# Patient Record
Sex: Male | Born: 1950 | Race: White | Hispanic: No | Marital: Married | Smoking: Former smoker
Health system: Southern US, Community
[De-identification: ages and names within clinical notes are randomized; demographics above are authoritative.]

## PROBLEM LIST (undated history)

## (undated) DIAGNOSIS — T4145XA Adverse effect of unspecified anesthetic, initial encounter: Secondary | ICD-10-CM

## (undated) DIAGNOSIS — H269 Unspecified cataract: Secondary | ICD-10-CM

## (undated) DIAGNOSIS — Z9889 Other specified postprocedural states: Secondary | ICD-10-CM

## (undated) DIAGNOSIS — M069 Rheumatoid arthritis, unspecified: Secondary | ICD-10-CM

## (undated) DIAGNOSIS — T753XXA Motion sickness, initial encounter: Secondary | ICD-10-CM

## (undated) DIAGNOSIS — T8859XA Other complications of anesthesia, initial encounter: Secondary | ICD-10-CM

## (undated) DIAGNOSIS — K635 Polyp of colon: Secondary | ICD-10-CM

## (undated) DIAGNOSIS — J329 Chronic sinusitis, unspecified: Secondary | ICD-10-CM

## (undated) DIAGNOSIS — R7303 Prediabetes: Secondary | ICD-10-CM

## (undated) DIAGNOSIS — M1990 Unspecified osteoarthritis, unspecified site: Secondary | ICD-10-CM

## (undated) DIAGNOSIS — G629 Polyneuropathy, unspecified: Secondary | ICD-10-CM

## (undated) DIAGNOSIS — J302 Other seasonal allergic rhinitis: Secondary | ICD-10-CM

## (undated) DIAGNOSIS — M21372 Foot drop, left foot: Secondary | ICD-10-CM

## (undated) DIAGNOSIS — K219 Gastro-esophageal reflux disease without esophagitis: Secondary | ICD-10-CM

## (undated) DIAGNOSIS — R112 Nausea with vomiting, unspecified: Secondary | ICD-10-CM

## (undated) DIAGNOSIS — E785 Hyperlipidemia, unspecified: Secondary | ICD-10-CM

## (undated) DIAGNOSIS — I10 Essential (primary) hypertension: Secondary | ICD-10-CM

## (undated) DIAGNOSIS — R42 Dizziness and giddiness: Secondary | ICD-10-CM

## (undated) HISTORY — PX: VASECTOMY: SHX75

## (undated) HISTORY — PX: TOTAL HIP ARTHROPLASTY: SHX124

## (undated) HISTORY — PX: EYE SURGERY: SHX253

## (undated) HISTORY — DX: Rheumatoid arthritis, unspecified: M06.9

## (undated) HISTORY — DX: Unspecified cataract: H26.9

## (undated) HISTORY — PX: APPENDECTOMY: SHX54

## (undated) HISTORY — PX: JOINT REPLACEMENT: SHX530

## (undated) HISTORY — PX: BACK SURGERY: SHX140

## (undated) HISTORY — PX: SPINE SURGERY: SHX786

## (undated) HISTORY — DX: Polyp of colon: K63.5

---
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
## 1969-02-20 HISTORY — PX: APPENDECTOMY: SHX54

## 2008-04-07 ENCOUNTER — Ambulatory Visit: Payer: Self-pay | Admitting: Internal Medicine

## 2008-04-07 ENCOUNTER — Ambulatory Visit (HOSPITAL_COMMUNITY): Admission: RE | Admit: 2008-04-07 | Discharge: 2008-04-07 | Payer: Self-pay | Admitting: Internal Medicine

## 2008-04-22 ENCOUNTER — Encounter: Payer: Self-pay | Admitting: Internal Medicine

## 2008-06-25 ENCOUNTER — Ambulatory Visit (HOSPITAL_COMMUNITY): Admission: RE | Admit: 2008-06-25 | Discharge: 2008-06-25 | Payer: Self-pay | Admitting: Family Medicine

## 2010-05-18 ENCOUNTER — Other Ambulatory Visit: Payer: Self-pay | Admitting: Neurosurgery

## 2010-05-18 DIAGNOSIS — M47816 Spondylosis without myelopathy or radiculopathy, lumbar region: Secondary | ICD-10-CM

## 2010-05-19 ENCOUNTER — Ambulatory Visit
Admission: RE | Admit: 2010-05-19 | Discharge: 2010-05-19 | Disposition: A | Discharge status: Home / Self Care | Payer: 59 | Source: Ambulatory Visit | Attending: Neurosurgery | Admitting: Neurosurgery

## 2010-05-19 ENCOUNTER — Other Ambulatory Visit: Payer: Self-pay | Admitting: Neurosurgery

## 2010-05-19 DIAGNOSIS — M47816 Spondylosis without myelopathy or radiculopathy, lumbar region: Secondary | ICD-10-CM

## 2010-06-15 ENCOUNTER — Other Ambulatory Visit: Payer: Self-pay | Admitting: Neurosurgery

## 2010-06-15 DIAGNOSIS — M47816 Spondylosis without myelopathy or radiculopathy, lumbar region: Secondary | ICD-10-CM

## 2010-06-16 ENCOUNTER — Ambulatory Visit
Admission: RE | Admit: 2010-06-16 | Discharge: 2010-06-16 | Disposition: A | Discharge status: Home / Self Care | Payer: 59 | Source: Ambulatory Visit | Attending: Neurosurgery | Admitting: Neurosurgery

## 2010-06-16 DIAGNOSIS — M47816 Spondylosis without myelopathy or radiculopathy, lumbar region: Secondary | ICD-10-CM

## 2010-07-05 NOTE — Op Note (Signed)
NAMEEUAN, Lawrence White NO.:  [id number]   MEDICAL RECORD NO.:  [id number]          PATIENT TYPE:  AMB   LOCATION:  DAY                           FACILITY:  [facility]   PHYSICIAN:  R. Roetta Sessions, M.D. DATE OF BIRTH:  November 27, 1950   DATE OF PROCEDURE:  04/07/2008  DATE OF DISCHARGE:                               OPERATIVE REPORT   PROCEDURE:  Surveillance colonoscopy.   INDICATIONS FOR PROCEDURE:  A 60 year old gentleman who underwent a  colonoscopy in [place], [place] 5 years ago.  A polyp was removed.  No  further information is known.  However, he was told return in 5 years  for a surveillance examination.  [name] is having no lower GI tract  symptoms.  There is no family history of polyps or colorectal neoplasia.  Colonoscopy is now being done as a surveillance maneuver.  The risks,  benefits, alternatives and limitations have been reviewed and questions  answered.  Please see the documentation in the medical record.   PROCEDURE NOTE:  O2 saturation, blood pressure, pulse and respiration  were monitored throughout the entire procedure.  Conscious sedation with  Versed 4 mg IV and Demerol 75 mg IV in divided doses.   INSTRUMENT:  Pentax video chip system.   FINDINGS:  Digital rectal exam revealed no abnormalities.   Endoscopic findings:  The prep was excellent.  Colon:  Colonic mucosa  was surveyed from the rectosigmoid junction through the left,  transverse, right colon to the appendiceal orifice, ileocecal valve and  cecum.  These structures were well seen and photographed for the record.  The terminal ileum was intubated about 10 cm.  From this level, the  scope was slowly and cautiously withdrawn.  All previously mentioned  mucosal surfaces were again seen.  The patient was noted to have just a  couple of left-sided diverticula.  The remainder of the colonic mucosa  and terminal ileum mucosa appeared entirely normal.  The scope was  pulled down in the  rectum where a thorough examination of the rectal  mucosa, including a retroflexed view of the anal verge demonstrated some  minimal anal canal hemorrhoids only.  The patient tolerated the  procedure well and was reactive in endoscopy.  Cecal withdrawal time 7  minutes.   IMPRESSION:  Anal canal hemorrhoids, otherwise normal rectum.  A few  left-sided diverticula.  Remainder of the colonic mucosa and terminal  ileum mucosa appeared normal.   RECOMMENDATION:  Diverticulosis literature provided to [name].  Without having further information about his prior history of polyps,  would recommend [name] return in 5 years for a repeat surveillance  examination.      Jonathon Bellows, M.D.  Electronically Signed     RMR/MEDQ  D:  04/07/2008  T:  04/07/2008  Job:  [id number]   cc:   Melvyn Novas, MD  Fax: [phone]

## 2010-08-30 ENCOUNTER — Other Ambulatory Visit: Payer: Self-pay | Admitting: Neurosurgery

## 2010-08-30 DIAGNOSIS — M47816 Spondylosis without myelopathy or radiculopathy, lumbar region: Secondary | ICD-10-CM

## 2010-08-31 ENCOUNTER — Ambulatory Visit
Admission: RE | Admit: 2010-08-31 | Discharge: 2010-08-31 | Disposition: A | Discharge status: Home / Self Care | Payer: 59 | Source: Ambulatory Visit | Attending: Neurosurgery | Admitting: Neurosurgery

## 2010-08-31 ENCOUNTER — Other Ambulatory Visit: Payer: Self-pay | Admitting: Neurosurgery

## 2010-08-31 DIAGNOSIS — M47816 Spondylosis without myelopathy or radiculopathy, lumbar region: Secondary | ICD-10-CM

## 2010-08-31 MED ORDER — METHYLPREDNISOLONE ACETATE 40 MG/ML IJ SUSP
120.0000 mg | Freq: Once | INTRAMUSCULAR | Status: AC
  Administered 2010-08-31: 120 mg via INTRA_ARTICULAR

## 2011-02-01 ENCOUNTER — Other Ambulatory Visit: Payer: Self-pay | Admitting: Neurosurgery

## 2011-02-01 DIAGNOSIS — M47816 Spondylosis without myelopathy or radiculopathy, lumbar region: Secondary | ICD-10-CM

## 2011-02-08 ENCOUNTER — Other Ambulatory Visit: Payer: Self-pay | Admitting: Neurosurgery

## 2011-02-08 ENCOUNTER — Ambulatory Visit
Admission: RE | Admit: 2011-02-08 | Discharge: 2011-02-08 | Disposition: A | Discharge status: Home / Self Care | Payer: 59 | Source: Ambulatory Visit | Attending: Neurosurgery | Admitting: Neurosurgery

## 2011-02-08 DIAGNOSIS — M47816 Spondylosis without myelopathy or radiculopathy, lumbar region: Secondary | ICD-10-CM

## 2011-02-08 MED ORDER — METHYLPREDNISOLONE ACETATE 40 MG/ML INJ SUSP (RADIOLOG
120.0000 mg | Freq: Once | INTRAMUSCULAR | Status: AC
  Administered 2011-02-08: 120 mg via INTRA_ARTICULAR

## 2011-02-08 MED ORDER — IOHEXOL 180 MG/ML  SOLN
1.0000 mL | Freq: Once | INTRAMUSCULAR | Status: AC | PRN
  Administered 2011-02-08: 1 mL via INTRA_ARTICULAR

## 2011-05-12 ENCOUNTER — Other Ambulatory Visit: Payer: Self-pay | Admitting: Neurosurgery

## 2011-05-12 DIAGNOSIS — M47816 Spondylosis without myelopathy or radiculopathy, lumbar region: Secondary | ICD-10-CM

## 2011-05-17 ENCOUNTER — Other Ambulatory Visit: Payer: Self-pay | Admitting: Neurosurgery

## 2011-05-17 ENCOUNTER — Ambulatory Visit
Admission: RE | Admit: 2011-05-17 | Discharge: 2011-05-17 | Disposition: A | Discharge status: Home / Self Care | Payer: 59 | Source: Ambulatory Visit | Attending: Neurosurgery | Admitting: Neurosurgery

## 2011-05-17 VITALS — BP 140/82 | HR 65

## 2011-05-17 DIAGNOSIS — M47816 Spondylosis without myelopathy or radiculopathy, lumbar region: Secondary | ICD-10-CM

## 2011-05-17 DIAGNOSIS — M5126 Other intervertebral disc displacement, lumbar region: Secondary | ICD-10-CM

## 2011-05-17 MED ORDER — METHYLPREDNISOLONE ACETATE 40 MG/ML INJ SUSP (RADIOLOG
120.0000 mg | Freq: Once | INTRAMUSCULAR | Status: AC
  Administered 2011-05-17: 120 mg via INTRA_ARTICULAR

## 2011-05-17 MED ORDER — IOHEXOL 180 MG/ML  SOLN
1.0000 mL | Freq: Once | INTRAMUSCULAR | Status: AC | PRN
  Administered 2011-05-17: 1 mL via INTRA_ARTICULAR

## 2011-05-17 NOTE — Discharge Instructions (Signed)

## 2011-09-01 ENCOUNTER — Other Ambulatory Visit: Payer: Self-pay | Admitting: Neurosurgery

## 2011-09-01 DIAGNOSIS — M47816 Spondylosis without myelopathy or radiculopathy, lumbar region: Secondary | ICD-10-CM

## 2011-09-07 ENCOUNTER — Other Ambulatory Visit: Payer: Self-pay | Admitting: Neurosurgery

## 2011-09-07 ENCOUNTER — Ambulatory Visit
Admission: RE | Admit: 2011-09-07 | Discharge: 2011-09-07 | Disposition: A | Discharge status: Home / Self Care | Payer: 59 | Source: Ambulatory Visit | Attending: Neurosurgery | Admitting: Neurosurgery

## 2011-09-07 DIAGNOSIS — M47816 Spondylosis without myelopathy or radiculopathy, lumbar region: Secondary | ICD-10-CM

## 2011-09-07 MED ORDER — METHYLPREDNISOLONE ACETATE 40 MG/ML INJ SUSP (RADIOLOG
120.0000 mg | Freq: Once | INTRAMUSCULAR | Status: AC
  Administered 2011-09-07: 120 mg via EPIDURAL

## 2011-09-07 MED ORDER — IOHEXOL 180 MG/ML  SOLN
1.0000 mL | Freq: Once | INTRAMUSCULAR | Status: AC | PRN
  Administered 2011-09-07: 1 mL via EPIDURAL

## 2012-01-23 ENCOUNTER — Other Ambulatory Visit: Payer: Self-pay | Admitting: Neurosurgery

## 2012-01-23 DIAGNOSIS — M47816 Spondylosis without myelopathy or radiculopathy, lumbar region: Secondary | ICD-10-CM

## 2012-01-29 ENCOUNTER — Ambulatory Visit
Admission: RE | Admit: 2012-01-29 | Discharge: 2012-01-29 | Disposition: A | Discharge status: Home / Self Care | Payer: 59 | Source: Ambulatory Visit | Attending: Neurosurgery | Admitting: Neurosurgery

## 2012-01-29 ENCOUNTER — Other Ambulatory Visit: Payer: Self-pay | Admitting: Neurosurgery

## 2012-01-29 DIAGNOSIS — M47816 Spondylosis without myelopathy or radiculopathy, lumbar region: Secondary | ICD-10-CM

## 2012-01-29 MED ORDER — IOHEXOL 180 MG/ML  SOLN
1.0000 mL | Freq: Once | INTRAMUSCULAR | Status: AC | PRN
  Administered 2012-01-29: 1 mL via EPIDURAL

## 2012-01-29 MED ORDER — METHYLPREDNISOLONE ACETATE 40 MG/ML INJ SUSP (RADIOLOG
120.0000 mg | Freq: Once | INTRAMUSCULAR | Status: AC
  Administered 2012-01-29: 120 mg via EPIDURAL

## 2012-05-30 ENCOUNTER — Other Ambulatory Visit: Payer: Self-pay | Admitting: Neurosurgery

## 2012-05-30 DIAGNOSIS — M47816 Spondylosis without myelopathy or radiculopathy, lumbar region: Secondary | ICD-10-CM

## 2012-06-03 ENCOUNTER — Other Ambulatory Visit: Payer: Self-pay | Admitting: Neurosurgery

## 2012-06-03 ENCOUNTER — Ambulatory Visit
Admission: RE | Admit: 2012-06-03 | Discharge: 2012-06-03 | Disposition: A | Discharge status: Home / Self Care | Payer: 59 | Source: Ambulatory Visit | Attending: Neurosurgery | Admitting: Neurosurgery

## 2012-06-03 DIAGNOSIS — M47816 Spondylosis without myelopathy or radiculopathy, lumbar region: Secondary | ICD-10-CM

## 2012-06-03 MED ORDER — IOHEXOL 180 MG/ML  SOLN
1.0000 mL | Freq: Once | INTRAMUSCULAR | Status: AC | PRN
  Administered 2012-06-03: 1 mL via EPIDURAL

## 2012-06-03 MED ORDER — METHYLPREDNISOLONE ACETATE 40 MG/ML INJ SUSP (RADIOLOG
120.0000 mg | Freq: Once | INTRAMUSCULAR | Status: AC
  Administered 2012-06-03: 120 mg via EPIDURAL

## 2012-06-28 ENCOUNTER — Other Ambulatory Visit: Payer: Self-pay | Admitting: Neurosurgery

## 2012-06-28 DIAGNOSIS — M47816 Spondylosis without myelopathy or radiculopathy, lumbar region: Secondary | ICD-10-CM

## 2012-07-03 ENCOUNTER — Ambulatory Visit
Admission: RE | Admit: 2012-07-03 | Discharge: 2012-07-03 | Disposition: A | Discharge status: Home / Self Care | Payer: 59 | Source: Ambulatory Visit | Attending: Neurosurgery | Admitting: Neurosurgery

## 2012-07-03 DIAGNOSIS — M47816 Spondylosis without myelopathy or radiculopathy, lumbar region: Secondary | ICD-10-CM

## 2012-07-03 MED ORDER — METHYLPREDNISOLONE ACETATE 40 MG/ML INJ SUSP (RADIOLOG
120.0000 mg | Freq: Once | INTRAMUSCULAR | Status: AC
  Administered 2012-07-03: 120 mg via EPIDURAL

## 2012-07-03 MED ORDER — IOHEXOL 180 MG/ML  SOLN
1.0000 mL | Freq: Once | INTRAMUSCULAR | Status: AC | PRN
  Administered 2012-07-03: 1 mL via EPIDURAL

## 2012-09-18 ENCOUNTER — Other Ambulatory Visit: Payer: Self-pay | Admitting: Neurosurgery

## 2012-09-18 DIAGNOSIS — M47816 Spondylosis without myelopathy or radiculopathy, lumbar region: Secondary | ICD-10-CM

## 2012-10-01 ENCOUNTER — Other Ambulatory Visit: Payer: Self-pay | Admitting: Neurosurgery

## 2012-10-01 ENCOUNTER — Ambulatory Visit
Admission: RE | Admit: 2012-10-01 | Discharge: 2012-10-01 | Disposition: A | Discharge status: Home / Self Care | Payer: 59 | Source: Ambulatory Visit | Attending: Neurosurgery | Admitting: Neurosurgery

## 2012-10-01 DIAGNOSIS — M47816 Spondylosis without myelopathy or radiculopathy, lumbar region: Secondary | ICD-10-CM

## 2012-10-01 MED ORDER — METHYLPREDNISOLONE ACETATE 40 MG/ML INJ SUSP (RADIOLOG
120.0000 mg | Freq: Once | INTRAMUSCULAR | Status: AC
  Administered 2012-10-01: 120 mg via EPIDURAL

## 2012-10-01 MED ORDER — IOHEXOL 180 MG/ML  SOLN
1.0000 mL | Freq: Once | INTRAMUSCULAR | Status: AC | PRN
  Administered 2012-10-01: 1 mL via EPIDURAL

## 2012-10-03 ENCOUNTER — Other Ambulatory Visit: Payer: Self-pay | Admitting: Neurosurgery

## 2012-10-03 DIAGNOSIS — M47817 Spondylosis without myelopathy or radiculopathy, lumbosacral region: Secondary | ICD-10-CM

## 2012-10-24 ENCOUNTER — Ambulatory Visit
Admission: RE | Admit: 2012-10-24 | Discharge: 2012-10-24 | Disposition: A | Discharge status: Home / Self Care | Payer: 59 | Source: Ambulatory Visit | Attending: Neurosurgery | Admitting: Neurosurgery

## 2012-10-24 DIAGNOSIS — M47817 Spondylosis without myelopathy or radiculopathy, lumbosacral region: Secondary | ICD-10-CM

## 2012-10-24 MED ORDER — GADOBENATE DIMEGLUMINE 529 MG/ML IV SOLN
18.0000 mL | Freq: Once | INTRAVENOUS | Status: AC | PRN
  Administered 2012-10-24: 18 mL via INTRAVENOUS

## 2013-08-28 DIAGNOSIS — R5381 Other malaise: Secondary | ICD-10-CM | POA: Diagnosis not present

## 2013-08-28 DIAGNOSIS — Z125 Encounter for screening for malignant neoplasm of prostate: Secondary | ICD-10-CM | POA: Diagnosis not present

## 2013-08-28 DIAGNOSIS — R5383 Other fatigue: Secondary | ICD-10-CM | POA: Diagnosis not present

## 2013-08-28 DIAGNOSIS — I10 Essential (primary) hypertension: Secondary | ICD-10-CM | POA: Diagnosis not present

## 2013-08-28 DIAGNOSIS — M48061 Spinal stenosis, lumbar region without neurogenic claudication: Secondary | ICD-10-CM | POA: Diagnosis not present

## 2013-08-28 DIAGNOSIS — E782 Mixed hyperlipidemia: Secondary | ICD-10-CM | POA: Diagnosis not present

## 2013-08-28 DIAGNOSIS — E785 Hyperlipidemia, unspecified: Secondary | ICD-10-CM | POA: Diagnosis not present

## 2013-08-28 DIAGNOSIS — M549 Dorsalgia, unspecified: Secondary | ICD-10-CM | POA: Diagnosis not present

## 2013-08-28 DIAGNOSIS — E559 Vitamin D deficiency, unspecified: Secondary | ICD-10-CM | POA: Diagnosis not present

## 2013-09-03 DIAGNOSIS — M4310 Spondylolisthesis, site unspecified: Secondary | ICD-10-CM | POA: Diagnosis not present

## 2013-09-03 DIAGNOSIS — M47817 Spondylosis without myelopathy or radiculopathy, lumbosacral region: Secondary | ICD-10-CM | POA: Diagnosis not present

## 2014-01-05 DIAGNOSIS — Z23 Encounter for immunization: Secondary | ICD-10-CM | POA: Diagnosis not present

## 2014-01-20 DIAGNOSIS — M25519 Pain in unspecified shoulder: Secondary | ICD-10-CM | POA: Diagnosis not present

## 2014-01-20 DIAGNOSIS — M542 Cervicalgia: Secondary | ICD-10-CM | POA: Diagnosis not present

## 2014-01-29 ENCOUNTER — Other Ambulatory Visit (HOSPITAL_COMMUNITY): Payer: Self-pay | Admitting: Internal Medicine

## 2014-01-29 DIAGNOSIS — M542 Cervicalgia: Secondary | ICD-10-CM

## 2014-02-04 ENCOUNTER — Ambulatory Visit (HOSPITAL_COMMUNITY)
Admission: RE | Admit: 2014-02-04 | Discharge: 2014-02-04 | Disposition: A | Discharge status: Home / Self Care | Payer: Medicare Other | Source: Ambulatory Visit | Attending: Internal Medicine | Admitting: Internal Medicine

## 2014-02-04 DIAGNOSIS — M542 Cervicalgia: Secondary | ICD-10-CM | POA: Insufficient documentation

## 2014-02-04 DIAGNOSIS — M5022 Other cervical disc displacement, mid-cervical region: Secondary | ICD-10-CM | POA: Insufficient documentation

## 2014-02-04 DIAGNOSIS — M5030 Other cervical disc degeneration, unspecified cervical region: Secondary | ICD-10-CM | POA: Insufficient documentation

## 2014-02-04 DIAGNOSIS — M2578 Osteophyte, vertebrae: Secondary | ICD-10-CM | POA: Diagnosis not present

## 2014-02-04 DIAGNOSIS — M5021 Other cervical disc displacement,  high cervical region: Secondary | ICD-10-CM | POA: Insufficient documentation

## 2014-02-04 DIAGNOSIS — M436 Torticollis: Secondary | ICD-10-CM | POA: Diagnosis not present

## 2014-02-04 DIAGNOSIS — M25512 Pain in left shoulder: Secondary | ICD-10-CM | POA: Diagnosis not present

## 2014-02-04 DIAGNOSIS — M4802 Spinal stenosis, cervical region: Secondary | ICD-10-CM | POA: Diagnosis not present

## 2014-02-04 DIAGNOSIS — M47892 Other spondylosis, cervical region: Secondary | ICD-10-CM | POA: Diagnosis not present

## 2014-02-10 DIAGNOSIS — M47812 Spondylosis without myelopathy or radiculopathy, cervical region: Secondary | ICD-10-CM | POA: Diagnosis not present

## 2014-02-10 DIAGNOSIS — M47816 Spondylosis without myelopathy or radiculopathy, lumbar region: Secondary | ICD-10-CM | POA: Diagnosis not present

## 2014-02-10 DIAGNOSIS — M4317 Spondylolisthesis, lumbosacral region: Secondary | ICD-10-CM | POA: Diagnosis not present

## 2014-02-12 ENCOUNTER — Other Ambulatory Visit: Payer: Self-pay | Admitting: Neurosurgery

## 2014-02-12 DIAGNOSIS — M47812 Spondylosis without myelopathy or radiculopathy, cervical region: Secondary | ICD-10-CM

## 2014-02-25 DIAGNOSIS — I10 Essential (primary) hypertension: Secondary | ICD-10-CM | POA: Diagnosis not present

## 2014-02-25 DIAGNOSIS — E785 Hyperlipidemia, unspecified: Secondary | ICD-10-CM | POA: Diagnosis not present

## 2014-02-27 ENCOUNTER — Ambulatory Visit
Admission: RE | Admit: 2014-02-27 | Discharge: 2014-02-27 | Disposition: A | Discharge status: Home / Self Care | Payer: Medicare Other | Source: Ambulatory Visit | Attending: Neurosurgery | Admitting: Neurosurgery

## 2014-02-27 DIAGNOSIS — M47812 Spondylosis without myelopathy or radiculopathy, cervical region: Secondary | ICD-10-CM

## 2014-02-27 DIAGNOSIS — M5412 Radiculopathy, cervical region: Secondary | ICD-10-CM | POA: Diagnosis not present

## 2014-02-27 MED ORDER — TRIAMCINOLONE ACETONIDE 40 MG/ML IJ SUSP (RADIOLOGY)
60.0000 mg | Freq: Once | INTRAMUSCULAR | Status: AC
  Administered 2014-02-27: 60 mg via EPIDURAL

## 2014-02-27 MED ORDER — IOHEXOL 300 MG/ML  SOLN
1.0000 mL | Freq: Once | INTRAMUSCULAR | Status: AC | PRN
  Administered 2014-02-27: 1 mL via EPIDURAL

## 2014-02-27 NOTE — Discharge Instructions (Signed)

## 2014-03-03 DIAGNOSIS — E785 Hyperlipidemia, unspecified: Secondary | ICD-10-CM | POA: Diagnosis not present

## 2014-03-03 DIAGNOSIS — I10 Essential (primary) hypertension: Secondary | ICD-10-CM | POA: Diagnosis not present

## 2014-03-03 DIAGNOSIS — M542 Cervicalgia: Secondary | ICD-10-CM | POA: Diagnosis not present

## 2014-05-15 ENCOUNTER — Other Ambulatory Visit: Payer: Self-pay | Admitting: Neurosurgery

## 2014-05-15 DIAGNOSIS — M47812 Spondylosis without myelopathy or radiculopathy, cervical region: Secondary | ICD-10-CM

## 2014-05-22 ENCOUNTER — Ambulatory Visit
Admission: RE | Admit: 2014-05-22 | Discharge: 2014-05-22 | Disposition: A | Discharge status: Home / Self Care | Payer: Medicare Other | Source: Ambulatory Visit | Attending: Neurosurgery | Admitting: Neurosurgery

## 2014-05-22 DIAGNOSIS — M5412 Radiculopathy, cervical region: Secondary | ICD-10-CM | POA: Diagnosis not present

## 2014-05-22 DIAGNOSIS — M47812 Spondylosis without myelopathy or radiculopathy, cervical region: Secondary | ICD-10-CM

## 2014-05-22 MED ORDER — IOHEXOL 300 MG/ML  SOLN
1.0000 mL | Freq: Once | INTRAMUSCULAR | Status: AC | PRN
  Administered 2014-05-22: 1 mL via EPIDURAL

## 2014-05-22 MED ORDER — TRIAMCINOLONE ACETONIDE 40 MG/ML IJ SUSP (RADIOLOGY)
60.0000 mg | Freq: Once | INTRAMUSCULAR | Status: AC
  Administered 2014-05-22: 60 mg via EPIDURAL

## 2014-05-27 ENCOUNTER — Encounter: Payer: Self-pay | Admitting: Internal Medicine

## 2014-08-31 DIAGNOSIS — E782 Mixed hyperlipidemia: Secondary | ICD-10-CM | POA: Diagnosis not present

## 2014-08-31 DIAGNOSIS — Z125 Encounter for screening for malignant neoplasm of prostate: Secondary | ICD-10-CM | POA: Diagnosis not present

## 2014-08-31 DIAGNOSIS — I10 Essential (primary) hypertension: Secondary | ICD-10-CM | POA: Diagnosis not present

## 2014-08-31 DIAGNOSIS — E119 Type 2 diabetes mellitus without complications: Secondary | ICD-10-CM | POA: Diagnosis not present

## 2014-09-01 DIAGNOSIS — M4317 Spondylolisthesis, lumbosacral region: Secondary | ICD-10-CM | POA: Diagnosis not present

## 2014-09-01 DIAGNOSIS — M47812 Spondylosis without myelopathy or radiculopathy, cervical region: Secondary | ICD-10-CM | POA: Diagnosis not present

## 2014-09-01 DIAGNOSIS — M47816 Spondylosis without myelopathy or radiculopathy, lumbar region: Secondary | ICD-10-CM | POA: Diagnosis not present

## 2014-09-02 DIAGNOSIS — E785 Hyperlipidemia, unspecified: Secondary | ICD-10-CM | POA: Diagnosis not present

## 2014-09-02 DIAGNOSIS — M542 Cervicalgia: Secondary | ICD-10-CM | POA: Diagnosis not present

## 2014-09-02 DIAGNOSIS — R7301 Impaired fasting glucose: Secondary | ICD-10-CM | POA: Diagnosis not present

## 2014-09-02 DIAGNOSIS — I10 Essential (primary) hypertension: Secondary | ICD-10-CM | POA: Diagnosis not present

## 2014-11-06 ENCOUNTER — Other Ambulatory Visit: Payer: Self-pay | Admitting: Neurosurgery

## 2014-11-06 DIAGNOSIS — M47816 Spondylosis without myelopathy or radiculopathy, lumbar region: Secondary | ICD-10-CM

## 2014-11-23 ENCOUNTER — Ambulatory Visit
Admission: RE | Admit: 2014-11-23 | Discharge: 2014-11-23 | Disposition: A | Discharge status: Home / Self Care | Payer: Medicare Other | Source: Ambulatory Visit | Attending: Neurosurgery | Admitting: Neurosurgery

## 2014-11-23 DIAGNOSIS — M47816 Spondylosis without myelopathy or radiculopathy, lumbar region: Secondary | ICD-10-CM

## 2014-11-23 DIAGNOSIS — M47812 Spondylosis without myelopathy or radiculopathy, cervical region: Secondary | ICD-10-CM | POA: Diagnosis not present

## 2014-11-23 MED ORDER — TRIAMCINOLONE ACETONIDE 40 MG/ML IJ SUSP (RADIOLOGY)
60.0000 mg | Freq: Once | INTRAMUSCULAR | Status: AC
  Administered 2014-11-23: 60 mg via EPIDURAL

## 2014-11-23 MED ORDER — IOHEXOL 300 MG/ML  SOLN
1.0000 mL | Freq: Once | INTRAMUSCULAR | Status: DC | PRN
  Administered 2014-11-23: 1 mL via EPIDURAL

## 2014-11-23 NOTE — Discharge Instructions (Signed)

## 2014-12-09 DIAGNOSIS — M47816 Spondylosis without myelopathy or radiculopathy, lumbar region: Secondary | ICD-10-CM | POA: Diagnosis not present

## 2014-12-09 DIAGNOSIS — M4317 Spondylolisthesis, lumbosacral region: Secondary | ICD-10-CM | POA: Diagnosis not present

## 2014-12-09 DIAGNOSIS — M47812 Spondylosis without myelopathy or radiculopathy, cervical region: Secondary | ICD-10-CM | POA: Diagnosis not present

## 2015-02-26 DIAGNOSIS — I10 Essential (primary) hypertension: Secondary | ICD-10-CM | POA: Diagnosis not present

## 2015-02-26 DIAGNOSIS — R7301 Impaired fasting glucose: Secondary | ICD-10-CM | POA: Diagnosis not present

## 2015-02-26 DIAGNOSIS — E782 Mixed hyperlipidemia: Secondary | ICD-10-CM | POA: Diagnosis not present

## 2015-03-02 DIAGNOSIS — J00 Acute nasopharyngitis [common cold]: Secondary | ICD-10-CM | POA: Diagnosis not present

## 2015-03-02 DIAGNOSIS — E782 Mixed hyperlipidemia: Secondary | ICD-10-CM | POA: Diagnosis not present

## 2015-03-02 DIAGNOSIS — I10 Essential (primary) hypertension: Secondary | ICD-10-CM | POA: Diagnosis not present

## 2015-03-02 DIAGNOSIS — R7301 Impaired fasting glucose: Secondary | ICD-10-CM | POA: Diagnosis not present

## 2015-03-04 DIAGNOSIS — M4317 Spondylolisthesis, lumbosacral region: Secondary | ICD-10-CM | POA: Diagnosis not present

## 2015-03-04 DIAGNOSIS — M47816 Spondylosis without myelopathy or radiculopathy, lumbar region: Secondary | ICD-10-CM | POA: Diagnosis not present

## 2015-03-05 ENCOUNTER — Other Ambulatory Visit: Payer: Self-pay | Admitting: Neurosurgery

## 2015-03-05 DIAGNOSIS — M545 Low back pain: Principal | ICD-10-CM

## 2015-03-05 DIAGNOSIS — G8929 Other chronic pain: Secondary | ICD-10-CM

## 2015-03-05 DIAGNOSIS — M5431 Sciatica, right side: Secondary | ICD-10-CM

## 2015-03-11 ENCOUNTER — Other Ambulatory Visit: Payer: Self-pay | Admitting: Neurosurgery

## 2015-03-11 ENCOUNTER — Ambulatory Visit
Admission: RE | Admit: 2015-03-11 | Discharge: 2015-03-11 | Disposition: A | Discharge status: Home / Self Care | Payer: Medicare Other | Source: Ambulatory Visit | Attending: Neurosurgery | Admitting: Neurosurgery

## 2015-03-11 DIAGNOSIS — M545 Low back pain, unspecified: Secondary | ICD-10-CM

## 2015-03-11 DIAGNOSIS — G8929 Other chronic pain: Secondary | ICD-10-CM

## 2015-03-11 DIAGNOSIS — M5431 Sciatica, right side: Secondary | ICD-10-CM

## 2015-03-11 DIAGNOSIS — M79604 Pain in right leg: Secondary | ICD-10-CM | POA: Diagnosis not present

## 2015-03-11 MED ORDER — METHYLPREDNISOLONE ACETATE 40 MG/ML INJ SUSP (RADIOLOG
120.0000 mg | Freq: Once | INTRAMUSCULAR | Status: AC
  Administered 2015-03-11: 120 mg via EPIDURAL

## 2015-03-11 MED ORDER — IOHEXOL 180 MG/ML  SOLN
1.0000 mL | Freq: Once | INTRAMUSCULAR | Status: AC | PRN
Start: 2015-03-11 — End: 2015-03-11
  Administered 2015-03-11: 1 mL via EPIDURAL

## 2015-03-11 NOTE — Discharge Instructions (Signed)

## 2015-03-30 DIAGNOSIS — J019 Acute sinusitis, unspecified: Secondary | ICD-10-CM | POA: Diagnosis not present

## 2015-03-30 DIAGNOSIS — R51 Headache: Secondary | ICD-10-CM | POA: Diagnosis not present

## 2015-03-30 DIAGNOSIS — R7301 Impaired fasting glucose: Secondary | ICD-10-CM | POA: Diagnosis not present

## 2015-03-30 DIAGNOSIS — I10 Essential (primary) hypertension: Secondary | ICD-10-CM | POA: Diagnosis not present

## 2015-04-01 ENCOUNTER — Emergency Department (HOSPITAL_COMMUNITY)
Admission: EM | Admit: 2015-04-01 | Discharge: 2015-04-01 | Disposition: A | Discharge status: Home / Self Care | Payer: Medicare Other | Attending: Emergency Medicine | Admitting: Emergency Medicine

## 2015-04-01 ENCOUNTER — Encounter (HOSPITAL_COMMUNITY): Payer: Self-pay

## 2015-04-01 ENCOUNTER — Emergency Department (HOSPITAL_COMMUNITY): Payer: Medicare Other

## 2015-04-01 DIAGNOSIS — E785 Hyperlipidemia, unspecified: Secondary | ICD-10-CM | POA: Insufficient documentation

## 2015-04-01 DIAGNOSIS — Z791 Long term (current) use of non-steroidal anti-inflammatories (NSAID): Secondary | ICD-10-CM | POA: Insufficient documentation

## 2015-04-01 DIAGNOSIS — R404 Transient alteration of awareness: Secondary | ICD-10-CM | POA: Diagnosis not present

## 2015-04-01 DIAGNOSIS — R5383 Other fatigue: Secondary | ICD-10-CM | POA: Insufficient documentation

## 2015-04-01 DIAGNOSIS — R531 Weakness: Secondary | ICD-10-CM | POA: Insufficient documentation

## 2015-04-01 DIAGNOSIS — Z79899 Other long term (current) drug therapy: Secondary | ICD-10-CM | POA: Insufficient documentation

## 2015-04-01 DIAGNOSIS — R110 Nausea: Secondary | ICD-10-CM | POA: Diagnosis not present

## 2015-04-01 DIAGNOSIS — I10 Essential (primary) hypertension: Secondary | ICD-10-CM | POA: Insufficient documentation

## 2015-04-01 DIAGNOSIS — Z8709 Personal history of other diseases of the respiratory system: Secondary | ICD-10-CM | POA: Insufficient documentation

## 2015-04-01 DIAGNOSIS — R5381 Other malaise: Secondary | ICD-10-CM | POA: Diagnosis not present

## 2015-04-01 DIAGNOSIS — R0981 Nasal congestion: Secondary | ICD-10-CM | POA: Insufficient documentation

## 2015-04-01 DIAGNOSIS — Z880 Allergy status to penicillin: Secondary | ICD-10-CM | POA: Diagnosis not present

## 2015-04-01 DIAGNOSIS — R197 Diarrhea, unspecified: Secondary | ICD-10-CM | POA: Insufficient documentation

## 2015-04-01 DIAGNOSIS — K219 Gastro-esophageal reflux disease without esophagitis: Secondary | ICD-10-CM | POA: Insufficient documentation

## 2015-04-01 DIAGNOSIS — R55 Syncope and collapse: Secondary | ICD-10-CM

## 2015-04-01 DIAGNOSIS — E860 Dehydration: Secondary | ICD-10-CM | POA: Diagnosis not present

## 2015-04-01 DIAGNOSIS — Z87891 Personal history of nicotine dependence: Secondary | ICD-10-CM | POA: Insufficient documentation

## 2015-04-01 HISTORY — DX: Gastro-esophageal reflux disease without esophagitis: K21.9

## 2015-04-01 HISTORY — DX: Essential (primary) hypertension: I10

## 2015-04-01 HISTORY — DX: Chronic sinusitis, unspecified: J32.9

## 2015-04-01 HISTORY — DX: Hyperlipidemia, unspecified: E78.5

## 2015-04-01 LAB — CBC WITH DIFFERENTIAL/PLATELET
BASOS ABS: 0 10*3/uL (ref 0.0–0.1)
BASOS PCT: 0 %
Eosinophils Absolute: 0.2 10*3/uL (ref 0.0–0.7)
Eosinophils Relative: 2 %
HEMATOCRIT: 37.1 % — AB (ref 39.0–52.0)
Hemoglobin: 12.6 g/dL — ABNORMAL LOW (ref 13.0–17.0)
LYMPHS PCT: 26 %
Lymphs Abs: 2.6 10*3/uL (ref 0.7–4.0)
MCH: 28.7 pg (ref 26.0–34.0)
MCHC: 34 g/dL (ref 30.0–36.0)
MCV: 84.5 fL (ref 78.0–100.0)
MONO ABS: 0.7 10*3/uL (ref 0.1–1.0)
Monocytes Relative: 7 %
NEUTROS ABS: 6.5 10*3/uL (ref 1.7–7.7)
NEUTROS PCT: 65 %
Platelets: 209 10*3/uL (ref 150–400)
RBC: 4.39 MIL/uL (ref 4.22–5.81)
RDW: 12.8 % (ref 11.5–15.5)
WBC: 9.9 10*3/uL (ref 4.0–10.5)

## 2015-04-01 LAB — COMPREHENSIVE METABOLIC PANEL
ALBUMIN: 3.4 g/dL — AB (ref 3.5–5.0)
ALT: 22 U/L (ref 17–63)
AST: 24 U/L (ref 15–41)
Alkaline Phosphatase: 64 U/L (ref 38–126)
Anion gap: 8 (ref 5–15)
BILIRUBIN TOTAL: 0.7 mg/dL (ref 0.3–1.2)
BUN: 22 mg/dL — ABNORMAL HIGH (ref 6–20)
CALCIUM: 8.3 mg/dL — AB (ref 8.9–10.3)
CHLORIDE: 105 mmol/L (ref 101–111)
CO2: 23 mmol/L (ref 22–32)
CREATININE: 0.9 mg/dL (ref 0.61–1.24)
GFR calc Af Amer: >60 mL/min (ref >60)
GFR calc non Af Amer: >60 mL/min (ref >60)
GLUCOSE: 138 mg/dL — AB (ref 65–99)
POTASSIUM: 4.5 mmol/L (ref 3.5–5.1)
Sodium: 136 mmol/L (ref 135–145)
Total Protein: 5.9 g/dL — ABNORMAL LOW (ref 6.5–8.1)

## 2015-04-01 LAB — TROPONIN I: Troponin I: <0.03 ng/mL (ref <0.031)

## 2015-04-01 MED ORDER — ONDANSETRON HCL 4 MG/2ML IJ SOLN
4.0000 mg | Freq: Once | INTRAMUSCULAR | Status: AC
  Administered 2015-04-01: 4 mg via INTRAVENOUS
  Filled 2015-04-01: qty 2

## 2015-04-01 MED ORDER — SODIUM CHLORIDE 0.9 % IV BOLUS (SEPSIS)
2000.0000 mL | Freq: Once | INTRAVENOUS | Status: AC
  Administered 2015-04-01: 2000 mL via INTRAVENOUS

## 2015-04-01 MED ORDER — ONDANSETRON 4 MG PO TBDP: ORAL_TABLET | ORAL | Status: DC

## 2015-04-01 NOTE — Discharge Instructions (Signed)
Drink plenty of fluids rest for the next couple days. Follow-up with your doctor next week for recheck

## 2015-04-01 NOTE — ED Notes (Signed)
Pt in by ems after possibly taking too much mucinex today.  Pt has taken 3 doses of 12 hour mucinex thinking it was 4 hour mucinex.  Pt had a syncopal episode at home.

## 2015-04-01 NOTE — ED Notes (Signed)
Patient transported to CT 

## 2015-04-01 NOTE — ED Notes (Signed)
Pt states he took (2) Mucinex DM  guaifenesin and  dextromethorphan HBr @ 1030 am and again took (2) Mucinex DM at 1530.

## 2015-04-01 NOTE — ED Notes (Addendum)
Poison Control states that the medication dosage that patient took is non-toxic - report that the Dextromethorphan will likely make patient lethargic. No further recommendations.

## 2015-04-01 NOTE — ED Provider Notes (Signed)
CSN: [id number]     Arrival date & time 04/01/15  2039 History  By signing my name below, I, Budd Palmer, attest that this documentation has been prepared under the direction and in the presence of Bethann Berkshire, MD. Electronically Signed: Budd Palmer, ED Scribe. 04/01/2015. 9:12 PM.    Chief Complaint  Patient presents with  . Loss of Consciousness   Patient is a 65 y.o. male presenting with syncope. The history is provided by the patient. No language interpreter was used.  Loss of Consciousness Most recent episode:  Today Progression:  Resolved Chronicity:  New Relieved by:  Lying down Associated symptoms: dizziness, malaise/fatigue, nausea and weakness   Associated symptoms: no chest pain, no headaches, no seizures and no vomiting   Nausea:    Severity:  Mild   Onset quality:  Gradual   Timing:  Constant   Progression:  Unchanged  HPI Comments: ZARIN KNUPP is a 65 y.o. male former smoker with a PMHx of HTN and HLD brought in by ambulance, who presents to the Emergency Department complaining of a syncopal episode which occurred just PTA. Pt states he was sitting in a chair after dinner when he began to feel dizzy. He reports that on the way to the restroom he began to feel lightheaded and lay on the floor, where he was "fading in and out." Afterwards, he went to the restroom and had some diarrhea. He then lay in his bed for a time. He states that he went to the restroom again and had more diarrhea (5 episodes total). He reports associated nausea and weakness. He notes he has been feeling ill with a viral URI for the past few days, but states the diarrhea and lightheadedness did not begin until this evening. Pt denies vomiting.  Past Medical History  Diagnosis Date  . Hypertension   . Sinus infection   . Hyperlipidemia   . Gastroesophageal reflux    Past Surgical History  Procedure Laterality Date  . Back surgery     No family history on file. Social History  Substance  Use Topics  . Smoking status: Former Smoker -- 1.50 packs/day for 19 years    Types: Cigarettes    Quit date: 01/28/1974  . Smokeless tobacco: None  . Alcohol Use: No    Review of Systems  Constitutional: Positive for malaise/fatigue. Negative for appetite change and fatigue.  HENT: Positive for congestion. Negative for ear discharge and sinus pressure.   Eyes: Negative for discharge.  Respiratory: Positive for cough.   Cardiovascular: Positive for syncope. Negative for chest pain.  Gastrointestinal: Positive for nausea and diarrhea. Negative for vomiting and abdominal pain.  Genitourinary: Negative for frequency and hematuria.  Musculoskeletal: Negative for back pain.  Skin: Negative for rash.  Neurological: Positive for dizziness, syncope, weakness and light-headedness. Negative for seizures and headaches.  Psychiatric/Behavioral: Negative for hallucinations.    Allergies  Penicillins and Sulfa antibiotics  Home Medications   Prior to Admission medications   Medication Sig Start Date End Date Taking? Authorizing Provider  cefdinir (OMNICEF) 300 MG capsule Take 300 mg by mouth 2 (two) times daily. 10 day course starting on 03/31/2015 03/30/15  Yes Historical Provider, MD  Dextromethorphan-Guaifenesin (MUCINEX DM MAXIMUM STRENGTH) 60-1200 MG TB12 Take 2 tablets by mouth 2 (two) times daily as needed (for cough and congestion).   Yes Historical Provider, MD  HYDROcodone-acetaminophen (NORCO/VICODIN) 5-325 MG tablet Take 1-2 tablets by mouth every 8 (eight) hours as needed for moderate pain or  severe pain.  03/04/15  Yes Historical Provider, MD  irbesartan (AVAPRO) 300 MG tablet Take 300 mg by mouth daily. 03/30/15  Yes Historical Provider, MD  loratadine (CLARITIN) 10 MG tablet Take 10 mg by mouth at bedtime.   Yes Historical Provider, MD  naproxen (NAPROSYN) 500 MG tablet Take 500 mg by mouth 2 (two) times daily. 03/17/15  Yes Historical Provider, MD  omeprazole (PRILOSEC) 20 MG capsule  Take 20 mg by mouth daily. 03/17/15  Yes Historical Provider, MD  phenylephrine (SUDAFED PE) 10 MG TABS tablet Take 10 mg by mouth daily as needed (for cold/congestion).    Yes Historical Provider, MD  rosuvastatin (CRESTOR) 10 MG tablet Take 10 mg by mouth daily. 02/19/15  Yes Historical Provider, MD   BP 123/80 mmHg  Pulse 66  Temp(Src) 97.5 F (36.4 C) (Oral)  Resp 26  Ht 6' (1.829 m)  Wt 199 lb (90.266 kg)  BMI 26.98 kg/m2  SpO2 100% Physical Exam  Constitutional: He is oriented to person, place, and time. He appears well-developed.  HENT:  Head: Normocephalic.  Dry mucous membranes  Eyes: Conjunctivae and EOM are normal. No scleral icterus.  Neck: Neck supple. No thyromegaly present.  Cardiovascular: Normal rate and regular rhythm.  Exam reveals no gallop and no friction rub.   No murmur heard. Pulmonary/Chest: No stridor. He has no wheezes. He has no rales. He exhibits no tenderness.  Abdominal: He exhibits no distension. There is no tenderness. There is no rebound.  Musculoskeletal: Normal range of motion. He exhibits no edema.  Lymphadenopathy:    He has no cervical adenopathy.  Neurological: He is oriented to person, place, and time. He exhibits normal muscle tone. Coordination normal.  Skin: No rash noted. No erythema.  Psychiatric: He has a normal mood and affect. His behavior is normal.    ED Course  Procedures  DIAGNOSTIC STUDIES: Oxygen Saturation is 100% on RA, normal by my interpretation.    COORDINATION OF CARE: 8:57 PM - Discussed plans to order diagnostic studies and IV fluids. Pt advised of plan for treatment and pt agrees.  Labs Review Labs Reviewed  CBC WITH DIFFERENTIAL/PLATELET - Abnormal; Notable for the following:    Hemoglobin 12.6 (*)    HCT 37.1 (*)    All other components within normal limits  COMPREHENSIVE METABOLIC PANEL - Abnormal; Notable for the following:    Glucose, Bld 138 (*)    BUN 22 (*)    Calcium 8.3 (*)    Total Protein 5.9  (*)    Albumin 3.4 (*)    All other components within normal limits  TROPONIN I    Imaging Review Ct Head Wo Contrast  04/01/2015  CLINICAL DATA:  Syncopal episode.  No reported head trauma. EXAM: CT HEAD WITHOUT CONTRAST TECHNIQUE: Contiguous axial images were obtained from the base of the skull through the vertex without intravenous contrast. COMPARISON:  None. FINDINGS: No evidence for acute infarction, hemorrhage, mass lesion, hydrocephalus, or extra-axial fluid. Mild cerebral and cerebellar atrophy. No definite white matter disease. Calvarium intact. Mild vascular calcification. Incompletely visualized skullbase demonstrates an ostiomeatal pattern of obstruction, with fluid in the LEFT ethmoid, LEFT maxillary, and slight LEFT frontal sinuses. Expansion of the medial wall of LEFT maxillary sinus could represent mucocele or antral choanal polyp. No mastoid fluid. Negative orbits. IMPRESSION: Mild atrophy.  No acute intracranial findings. LEFT paranasal sinus disease is incompletely evaluated. Consider elective sinus CT when the patient is stable. Electronically Signed   By: Jonny Ruiz  Abelino Derrick M.D.   On: 04/01/2015 22:00   Dg Abd Acute W/chest  04/01/2015  CLINICAL DATA:  Acute onset of generalized weakness. Syncope. Initial encounter. EXAM: DG ABDOMEN ACUTE W/ 1V CHEST COMPARISON:  MRI of the lumbar spine performed 10/24/2012 FINDINGS: The lungs are well-aerated and clear. There is no evidence of focal opacification, pleural effusion or pneumothorax. The cardiomediastinal silhouette is within normal limits. The visualized bowel gas pattern is unremarkable. Scattered stool and air are seen within the colon; there is no evidence of small bowel dilatation to suggest obstruction. No free intra-abdominal air is identified on the provided upright view. No acute osseous abnormalities are seen; the sacroiliac joints are unremarkable in appearance. Lumbar spinal fusion hardware is noted. IMPRESSION: 1. Unremarkable  bowel gas pattern; no free intra-abdominal air seen. Small amount of stool noted in the colon. 2. No acute cardiopulmonary process seen. Electronically Signed   By: Roanna Raider M.D.   On: 04/01/2015 22:12   I have personally reviewed and evaluated these images and lab results as part of my medical decision-making.   EKG Interpretation None      MDM   Final diagnoses:  None   Patient with near syncopal episode when having diarrhea and stomach cramps. Labs unremarkable. Patient was instructed to stay hydrated uses Zofran for nausea and follow-up with his PCP.j   The chart was scribed for me under my direct supervision.  I personally performed the history, physical, and medical decision making and all procedures in the evaluation of this patient.Bethann Berkshire, MD 04/01/15 [phone]

## 2015-04-01 NOTE — ED Notes (Signed)
Patient states he feels dizzy while sitting and standing.

## 2015-04-02 DIAGNOSIS — H8110 Benign paroxysmal vertigo, unspecified ear: Secondary | ICD-10-CM | POA: Diagnosis not present

## 2015-04-02 DIAGNOSIS — J019 Acute sinusitis, unspecified: Secondary | ICD-10-CM | POA: Diagnosis not present

## 2015-04-27 ENCOUNTER — Other Ambulatory Visit (INDEPENDENT_AMBULATORY_CARE_PROVIDER_SITE_OTHER): Payer: Self-pay | Admitting: Otolaryngology

## 2015-04-27 DIAGNOSIS — J321 Chronic frontal sinusitis: Secondary | ICD-10-CM

## 2015-04-27 DIAGNOSIS — J322 Chronic ethmoidal sinusitis: Secondary | ICD-10-CM | POA: Diagnosis not present

## 2015-04-27 DIAGNOSIS — J320 Chronic maxillary sinusitis: Secondary | ICD-10-CM | POA: Diagnosis not present

## 2015-05-03 ENCOUNTER — Ambulatory Visit (HOSPITAL_COMMUNITY)
Admission: RE | Admit: 2015-05-03 | Discharge: 2015-05-03 | Disposition: A | Discharge status: Home / Self Care | Payer: Medicare Other | Source: Ambulatory Visit | Attending: Otolaryngology | Admitting: Otolaryngology

## 2015-05-03 DIAGNOSIS — J320 Chronic maxillary sinusitis: Secondary | ICD-10-CM | POA: Diagnosis not present

## 2015-05-03 DIAGNOSIS — J321 Chronic frontal sinusitis: Secondary | ICD-10-CM | POA: Insufficient documentation

## 2015-05-03 DIAGNOSIS — J3489 Other specified disorders of nose and nasal sinuses: Secondary | ICD-10-CM | POA: Insufficient documentation

## 2015-05-03 DIAGNOSIS — J342 Deviated nasal septum: Secondary | ICD-10-CM | POA: Insufficient documentation

## 2015-05-20 ENCOUNTER — Ambulatory Visit (INDEPENDENT_AMBULATORY_CARE_PROVIDER_SITE_OTHER): Payer: Medicare Other | Admitting: Otolaryngology

## 2015-05-20 DIAGNOSIS — J320 Chronic maxillary sinusitis: Secondary | ICD-10-CM | POA: Diagnosis not present

## 2015-05-26 DIAGNOSIS — H52223 Regular astigmatism, bilateral: Secondary | ICD-10-CM | POA: Diagnosis not present

## 2015-05-26 DIAGNOSIS — H5203 Hypermetropia, bilateral: Secondary | ICD-10-CM | POA: Diagnosis not present

## 2015-05-26 DIAGNOSIS — H4389 Other disorders of vitreous body: Secondary | ICD-10-CM | POA: Diagnosis not present

## 2015-05-26 DIAGNOSIS — H2513 Age-related nuclear cataract, bilateral: Secondary | ICD-10-CM | POA: Diagnosis not present

## 2015-05-26 DIAGNOSIS — H04123 Dry eye syndrome of bilateral lacrimal glands: Secondary | ICD-10-CM | POA: Diagnosis not present

## 2015-05-26 DIAGNOSIS — H524 Presbyopia: Secondary | ICD-10-CM | POA: Diagnosis not present

## 2015-06-03 DIAGNOSIS — M4317 Spondylolisthesis, lumbosacral region: Secondary | ICD-10-CM | POA: Diagnosis not present

## 2015-06-03 DIAGNOSIS — M47816 Spondylosis without myelopathy or radiculopathy, lumbar region: Secondary | ICD-10-CM | POA: Diagnosis not present

## 2015-06-09 DIAGNOSIS — M960 Pseudarthrosis after fusion or arthrodesis: Secondary | ICD-10-CM | POA: Diagnosis not present

## 2015-06-09 DIAGNOSIS — M4326 Fusion of spine, lumbar region: Secondary | ICD-10-CM | POA: Diagnosis not present

## 2015-06-09 DIAGNOSIS — M47816 Spondylosis without myelopathy or radiculopathy, lumbar region: Secondary | ICD-10-CM | POA: Diagnosis not present

## 2015-06-09 DIAGNOSIS — M4807 Spinal stenosis, lumbosacral region: Secondary | ICD-10-CM | POA: Diagnosis not present

## 2015-06-10 ENCOUNTER — Ambulatory Visit (INDEPENDENT_AMBULATORY_CARE_PROVIDER_SITE_OTHER): Payer: Medicare Other | Admitting: Otolaryngology

## 2015-06-10 DIAGNOSIS — J343 Hypertrophy of nasal turbinates: Secondary | ICD-10-CM | POA: Diagnosis not present

## 2015-06-10 DIAGNOSIS — J310 Chronic rhinitis: Secondary | ICD-10-CM

## 2015-06-10 DIAGNOSIS — J0101 Acute recurrent maxillary sinusitis: Secondary | ICD-10-CM

## 2015-06-22 ENCOUNTER — Other Ambulatory Visit (INDEPENDENT_AMBULATORY_CARE_PROVIDER_SITE_OTHER): Payer: Self-pay | Admitting: Otolaryngology

## 2015-06-22 DIAGNOSIS — J329 Chronic sinusitis, unspecified: Secondary | ICD-10-CM

## 2015-06-23 ENCOUNTER — Ambulatory Visit (HOSPITAL_COMMUNITY)
Admission: RE | Admit: 2015-06-23 | Discharge: 2015-06-23 | Disposition: A | Discharge status: Home / Self Care | Payer: Medicare Other | Source: Ambulatory Visit | Attending: Otolaryngology | Admitting: Otolaryngology

## 2015-06-23 DIAGNOSIS — R938 Abnormal findings on diagnostic imaging of other specified body structures: Secondary | ICD-10-CM | POA: Insufficient documentation

## 2015-06-23 DIAGNOSIS — J0101 Acute recurrent maxillary sinusitis: Secondary | ICD-10-CM | POA: Insufficient documentation

## 2015-06-23 DIAGNOSIS — M4317 Spondylolisthesis, lumbosacral region: Secondary | ICD-10-CM | POA: Diagnosis not present

## 2015-06-23 DIAGNOSIS — R51 Headache: Secondary | ICD-10-CM | POA: Diagnosis not present

## 2015-06-23 DIAGNOSIS — J329 Chronic sinusitis, unspecified: Secondary | ICD-10-CM

## 2015-06-23 DIAGNOSIS — M47816 Spondylosis without myelopathy or radiculopathy, lumbar region: Secondary | ICD-10-CM | POA: Diagnosis not present

## 2015-06-24 ENCOUNTER — Ambulatory Visit (INDEPENDENT_AMBULATORY_CARE_PROVIDER_SITE_OTHER): Payer: Medicare Other | Admitting: Otolaryngology

## 2015-06-24 DIAGNOSIS — J320 Chronic maxillary sinusitis: Secondary | ICD-10-CM

## 2015-06-24 DIAGNOSIS — R51 Headache: Secondary | ICD-10-CM | POA: Diagnosis not present

## 2015-06-30 DIAGNOSIS — M545 Low back pain: Secondary | ICD-10-CM | POA: Diagnosis not present

## 2015-06-30 DIAGNOSIS — R42 Dizziness and giddiness: Secondary | ICD-10-CM | POA: Diagnosis not present

## 2015-06-30 DIAGNOSIS — R51 Headache: Secondary | ICD-10-CM | POA: Diagnosis not present

## 2015-06-30 DIAGNOSIS — I10 Essential (primary) hypertension: Secondary | ICD-10-CM | POA: Diagnosis not present

## 2015-07-16 DIAGNOSIS — I10 Essential (primary) hypertension: Secondary | ICD-10-CM | POA: Diagnosis not present

## 2015-07-16 DIAGNOSIS — R42 Dizziness and giddiness: Secondary | ICD-10-CM | POA: Diagnosis not present

## 2015-08-03 DIAGNOSIS — I10 Essential (primary) hypertension: Secondary | ICD-10-CM | POA: Diagnosis not present

## 2015-08-03 DIAGNOSIS — R42 Dizziness and giddiness: Secondary | ICD-10-CM | POA: Diagnosis not present

## 2015-08-19 DIAGNOSIS — M4317 Spondylolisthesis, lumbosacral region: Secondary | ICD-10-CM | POA: Diagnosis not present

## 2015-08-19 DIAGNOSIS — M47816 Spondylosis without myelopathy or radiculopathy, lumbar region: Secondary | ICD-10-CM | POA: Diagnosis not present

## 2015-09-21 DIAGNOSIS — L237 Allergic contact dermatitis due to plants, except food: Secondary | ICD-10-CM | POA: Diagnosis not present

## 2015-09-28 DIAGNOSIS — Z01818 Encounter for other preprocedural examination: Secondary | ICD-10-CM | POA: Diagnosis not present

## 2015-09-30 DIAGNOSIS — Y838 Other surgical procedures as the cause of abnormal reaction of the patient, or of later complication, without mention of misadventure at the time of the procedure: Secondary | ICD-10-CM | POA: Diagnosis not present

## 2015-09-30 DIAGNOSIS — I10 Essential (primary) hypertension: Secondary | ICD-10-CM | POA: Diagnosis not present

## 2015-09-30 DIAGNOSIS — M4326 Fusion of spine, lumbar region: Secondary | ICD-10-CM | POA: Diagnosis not present

## 2015-09-30 DIAGNOSIS — Z79891 Long term (current) use of opiate analgesic: Secondary | ICD-10-CM | POA: Diagnosis not present

## 2015-09-30 DIAGNOSIS — Z882 Allergy status to sulfonamides status: Secondary | ICD-10-CM | POA: Diagnosis not present

## 2015-09-30 DIAGNOSIS — M47816 Spondylosis without myelopathy or radiculopathy, lumbar region: Secondary | ICD-10-CM | POA: Diagnosis not present

## 2015-09-30 DIAGNOSIS — M960 Pseudarthrosis after fusion or arthrodesis: Secondary | ICD-10-CM | POA: Diagnosis not present

## 2015-09-30 DIAGNOSIS — E7800 Pure hypercholesterolemia, unspecified: Secondary | ICD-10-CM | POA: Diagnosis not present

## 2015-09-30 DIAGNOSIS — K219 Gastro-esophageal reflux disease without esophagitis: Secondary | ICD-10-CM | POA: Diagnosis not present

## 2015-09-30 DIAGNOSIS — Z880 Allergy status to penicillin: Secondary | ICD-10-CM | POA: Diagnosis not present

## 2015-09-30 DIAGNOSIS — Z79899 Other long term (current) drug therapy: Secondary | ICD-10-CM | POA: Diagnosis not present

## 2015-10-04 DIAGNOSIS — M6281 Muscle weakness (generalized): Secondary | ICD-10-CM | POA: Diagnosis not present

## 2015-10-04 DIAGNOSIS — Z4789 Encounter for other orthopedic aftercare: Secondary | ICD-10-CM | POA: Diagnosis not present

## 2015-10-04 DIAGNOSIS — Z9181 History of falling: Secondary | ICD-10-CM | POA: Diagnosis not present

## 2015-10-04 DIAGNOSIS — I10 Essential (primary) hypertension: Secondary | ICD-10-CM | POA: Diagnosis not present

## 2015-10-06 DIAGNOSIS — Z9181 History of falling: Secondary | ICD-10-CM | POA: Diagnosis not present

## 2015-10-06 DIAGNOSIS — Z4789 Encounter for other orthopedic aftercare: Secondary | ICD-10-CM | POA: Diagnosis not present

## 2015-10-06 DIAGNOSIS — M6281 Muscle weakness (generalized): Secondary | ICD-10-CM | POA: Diagnosis not present

## 2015-10-06 DIAGNOSIS — I10 Essential (primary) hypertension: Secondary | ICD-10-CM | POA: Diagnosis not present

## 2015-10-07 DIAGNOSIS — Z981 Arthrodesis status: Secondary | ICD-10-CM | POA: Diagnosis not present

## 2015-10-07 DIAGNOSIS — M47816 Spondylosis without myelopathy or radiculopathy, lumbar region: Secondary | ICD-10-CM | POA: Diagnosis not present

## 2015-10-11 DIAGNOSIS — Z9181 History of falling: Secondary | ICD-10-CM | POA: Diagnosis not present

## 2015-10-11 DIAGNOSIS — Z4789 Encounter for other orthopedic aftercare: Secondary | ICD-10-CM | POA: Diagnosis not present

## 2015-10-11 DIAGNOSIS — I10 Essential (primary) hypertension: Secondary | ICD-10-CM | POA: Diagnosis not present

## 2015-10-11 DIAGNOSIS — M6281 Muscle weakness (generalized): Secondary | ICD-10-CM | POA: Diagnosis not present

## 2015-10-13 DIAGNOSIS — M6281 Muscle weakness (generalized): Secondary | ICD-10-CM | POA: Diagnosis not present

## 2015-10-13 DIAGNOSIS — Z4789 Encounter for other orthopedic aftercare: Secondary | ICD-10-CM | POA: Diagnosis not present

## 2015-10-13 DIAGNOSIS — I10 Essential (primary) hypertension: Secondary | ICD-10-CM | POA: Diagnosis not present

## 2015-10-13 DIAGNOSIS — Z9181 History of falling: Secondary | ICD-10-CM | POA: Diagnosis not present

## 2015-10-19 DIAGNOSIS — M6281 Muscle weakness (generalized): Secondary | ICD-10-CM | POA: Diagnosis not present

## 2015-10-19 DIAGNOSIS — Z9181 History of falling: Secondary | ICD-10-CM | POA: Diagnosis not present

## 2015-10-19 DIAGNOSIS — I10 Essential (primary) hypertension: Secondary | ICD-10-CM | POA: Diagnosis not present

## 2015-10-19 DIAGNOSIS — Z4789 Encounter for other orthopedic aftercare: Secondary | ICD-10-CM | POA: Diagnosis not present

## 2015-11-08 DIAGNOSIS — E782 Mixed hyperlipidemia: Secondary | ICD-10-CM | POA: Diagnosis not present

## 2015-11-08 DIAGNOSIS — R7301 Impaired fasting glucose: Secondary | ICD-10-CM | POA: Diagnosis not present

## 2015-11-08 DIAGNOSIS — I10 Essential (primary) hypertension: Secondary | ICD-10-CM | POA: Diagnosis not present

## 2015-11-10 ENCOUNTER — Other Ambulatory Visit (HOSPITAL_COMMUNITY): Payer: Self-pay | Admitting: Nurse Practitioner

## 2015-11-10 DIAGNOSIS — K219 Gastro-esophageal reflux disease without esophagitis: Secondary | ICD-10-CM | POA: Diagnosis not present

## 2015-11-10 DIAGNOSIS — Z23 Encounter for immunization: Secondary | ICD-10-CM | POA: Diagnosis not present

## 2015-11-10 DIAGNOSIS — R7301 Impaired fasting glucose: Secondary | ICD-10-CM | POA: Diagnosis not present

## 2015-11-10 DIAGNOSIS — I10 Essential (primary) hypertension: Secondary | ICD-10-CM | POA: Diagnosis not present

## 2015-11-10 DIAGNOSIS — M47816 Spondylosis without myelopathy or radiculopathy, lumbar region: Secondary | ICD-10-CM

## 2015-11-10 DIAGNOSIS — M4317 Spondylolisthesis, lumbosacral region: Secondary | ICD-10-CM

## 2015-11-10 DIAGNOSIS — E782 Mixed hyperlipidemia: Secondary | ICD-10-CM | POA: Diagnosis not present

## 2015-11-10 DIAGNOSIS — M4806 Spinal stenosis, lumbar region: Secondary | ICD-10-CM | POA: Diagnosis not present

## 2015-11-24 ENCOUNTER — Ambulatory Visit (HOSPITAL_COMMUNITY)
Admission: RE | Admit: 2015-11-24 | Discharge: 2015-11-24 | Disposition: A | Discharge status: Home / Self Care | Payer: Medicare Other | Source: Ambulatory Visit | Attending: Nurse Practitioner | Admitting: Nurse Practitioner

## 2015-11-24 DIAGNOSIS — M47816 Spondylosis without myelopathy or radiculopathy, lumbar region: Secondary | ICD-10-CM

## 2015-11-24 DIAGNOSIS — M5124 Other intervertebral disc displacement, thoracic region: Secondary | ICD-10-CM | POA: Diagnosis not present

## 2015-11-24 DIAGNOSIS — M4317 Spondylolisthesis, lumbosacral region: Secondary | ICD-10-CM | POA: Diagnosis present

## 2015-11-24 DIAGNOSIS — M5126 Other intervertebral disc displacement, lumbar region: Secondary | ICD-10-CM | POA: Diagnosis not present

## 2015-12-21 DIAGNOSIS — R509 Fever, unspecified: Secondary | ICD-10-CM | POA: Diagnosis not present

## 2015-12-21 DIAGNOSIS — E860 Dehydration: Secondary | ICD-10-CM | POA: Diagnosis not present

## 2015-12-28 DIAGNOSIS — R05 Cough: Secondary | ICD-10-CM | POA: Diagnosis not present

## 2015-12-28 DIAGNOSIS — J040 Acute laryngitis: Secondary | ICD-10-CM | POA: Diagnosis not present

## 2016-01-05 DIAGNOSIS — R05 Cough: Secondary | ICD-10-CM | POA: Diagnosis not present

## 2016-01-05 DIAGNOSIS — J040 Acute laryngitis: Secondary | ICD-10-CM | POA: Diagnosis not present

## 2016-02-08 DIAGNOSIS — J370 Chronic laryngitis: Secondary | ICD-10-CM | POA: Diagnosis not present

## 2016-03-09 ENCOUNTER — Ambulatory Visit (INDEPENDENT_AMBULATORY_CARE_PROVIDER_SITE_OTHER): Payer: Medicare Other | Admitting: Otolaryngology

## 2016-03-09 DIAGNOSIS — R490 Dysphonia: Secondary | ICD-10-CM

## 2016-03-09 DIAGNOSIS — K219 Gastro-esophageal reflux disease without esophagitis: Secondary | ICD-10-CM | POA: Diagnosis not present

## 2016-03-16 DIAGNOSIS — M47816 Spondylosis without myelopathy or radiculopathy, lumbar region: Secondary | ICD-10-CM | POA: Diagnosis not present

## 2016-03-16 DIAGNOSIS — M4317 Spondylolisthesis, lumbosacral region: Secondary | ICD-10-CM | POA: Diagnosis not present

## 2016-03-20 ENCOUNTER — Other Ambulatory Visit: Payer: Self-pay | Admitting: Nurse Practitioner

## 2016-03-20 DIAGNOSIS — M47816 Spondylosis without myelopathy or radiculopathy, lumbar region: Secondary | ICD-10-CM

## 2016-03-31 ENCOUNTER — Ambulatory Visit
Admission: RE | Admit: 2016-03-31 | Discharge: 2016-03-31 | Disposition: A | Discharge status: Home / Self Care | Payer: Medicare Other | Source: Ambulatory Visit | Attending: Nurse Practitioner | Admitting: Nurse Practitioner

## 2016-03-31 DIAGNOSIS — M47816 Spondylosis without myelopathy or radiculopathy, lumbar region: Secondary | ICD-10-CM

## 2016-03-31 DIAGNOSIS — M47817 Spondylosis without myelopathy or radiculopathy, lumbosacral region: Secondary | ICD-10-CM | POA: Diagnosis not present

## 2016-03-31 MED ORDER — IOPAMIDOL (ISOVUE-M 200) INJECTION 41%
1.0000 mL | Freq: Once | INTRAMUSCULAR | Status: AC
  Administered 2016-03-31: 1 mL via EPIDURAL

## 2016-03-31 MED ORDER — METHYLPREDNISOLONE ACETATE 40 MG/ML INJ SUSP (RADIOLOG
120.0000 mg | Freq: Once | INTRAMUSCULAR | Status: AC
  Administered 2016-03-31: 120 mg via EPIDURAL

## 2016-03-31 NOTE — Discharge Instructions (Signed)

## 2016-04-25 ENCOUNTER — Other Ambulatory Visit: Payer: Self-pay | Admitting: Nurse Practitioner

## 2016-04-25 DIAGNOSIS — M47816 Spondylosis without myelopathy or radiculopathy, lumbar region: Secondary | ICD-10-CM

## 2016-04-28 ENCOUNTER — Ambulatory Visit
Admission: RE | Admit: 2016-04-28 | Discharge: 2016-04-28 | Disposition: A | Discharge status: Home / Self Care | Payer: Medicare Other | Source: Ambulatory Visit | Attending: Nurse Practitioner | Admitting: Nurse Practitioner

## 2016-04-28 DIAGNOSIS — M47816 Spondylosis without myelopathy or radiculopathy, lumbar region: Secondary | ICD-10-CM

## 2016-04-28 DIAGNOSIS — M47817 Spondylosis without myelopathy or radiculopathy, lumbosacral region: Secondary | ICD-10-CM | POA: Diagnosis not present

## 2016-04-28 MED ORDER — METHYLPREDNISOLONE ACETATE 40 MG/ML INJ SUSP (RADIOLOG
120.0000 mg | Freq: Once | INTRAMUSCULAR | Status: AC
  Administered 2016-04-28: 120 mg via EPIDURAL

## 2016-04-28 MED ORDER — IOPAMIDOL (ISOVUE-M 200) INJECTION 41%
1.0000 mL | Freq: Once | INTRAMUSCULAR | Status: AC
  Administered 2016-04-28: 1 mL via EPIDURAL

## 2016-04-28 NOTE — Discharge Instructions (Signed)

## 2016-05-08 ENCOUNTER — Other Ambulatory Visit: Payer: Medicare Other

## 2016-05-08 DIAGNOSIS — R7301 Impaired fasting glucose: Secondary | ICD-10-CM | POA: Diagnosis not present

## 2016-05-08 DIAGNOSIS — Z1159 Encounter for screening for other viral diseases: Secondary | ICD-10-CM | POA: Diagnosis not present

## 2016-05-08 DIAGNOSIS — I10 Essential (primary) hypertension: Secondary | ICD-10-CM | POA: Diagnosis not present

## 2016-05-09 DIAGNOSIS — M47816 Spondylosis without myelopathy or radiculopathy, lumbar region: Secondary | ICD-10-CM | POA: Diagnosis not present

## 2016-05-09 DIAGNOSIS — M4317 Spondylolisthesis, lumbosacral region: Secondary | ICD-10-CM | POA: Diagnosis not present

## 2016-05-10 DIAGNOSIS — R7301 Impaired fasting glucose: Secondary | ICD-10-CM | POA: Diagnosis not present

## 2016-05-10 DIAGNOSIS — I10 Essential (primary) hypertension: Secondary | ICD-10-CM | POA: Diagnosis not present

## 2016-05-10 DIAGNOSIS — E782 Mixed hyperlipidemia: Secondary | ICD-10-CM | POA: Diagnosis not present

## 2016-05-10 DIAGNOSIS — J302 Other seasonal allergic rhinitis: Secondary | ICD-10-CM | POA: Diagnosis not present

## 2016-05-10 DIAGNOSIS — Z6828 Body mass index (BMI) 28.0-28.9, adult: Secondary | ICD-10-CM | POA: Diagnosis not present

## 2016-05-10 DIAGNOSIS — K219 Gastro-esophageal reflux disease without esophagitis: Secondary | ICD-10-CM | POA: Diagnosis not present

## 2016-07-14 IMAGING — MR MR CERVICAL SPINE W/O CM
4 of 5 series · 14 of 48 positions shown · non-contrast
Comparison: None.

CLINICAL DATA: Neck pain. LEFT shoulder pain. Symptoms for 8 weeks.
Cervicalgia. Initial encounter.

EXAM:
MRI CERVICAL SPINE WITHOUT CONTRAST
TECHNIQUE: Multiplanar, multisequence MR imaging of the cervical spine was
performed. No intravenous contrast was administered.

[Series 3: T2 · sagittal · 3.0mm · 0.37mm/px · 5 of 13 slices shown (1 of 2)]
[im 1/13]
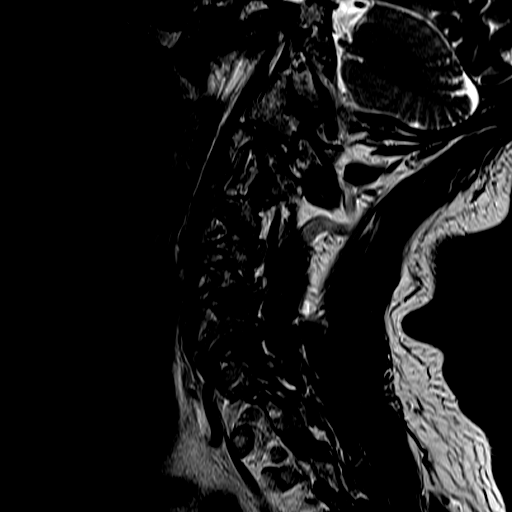
[im 4/13]
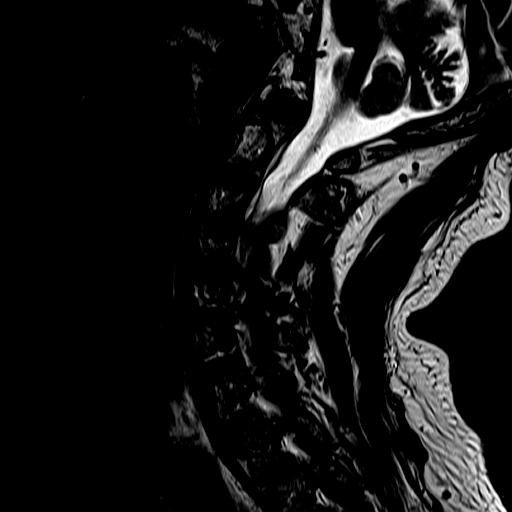
[im 7/13]
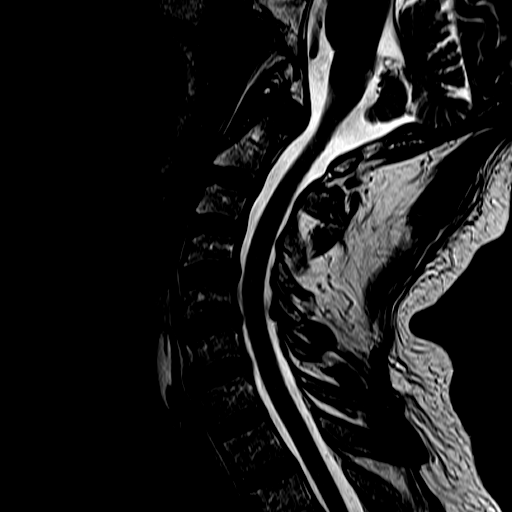
[im 10/13]
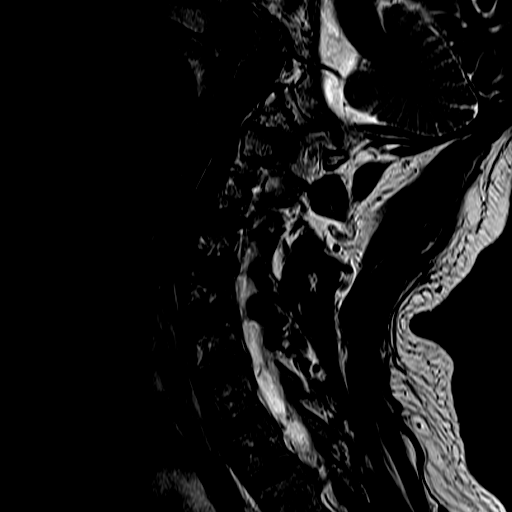
[im 13/13]
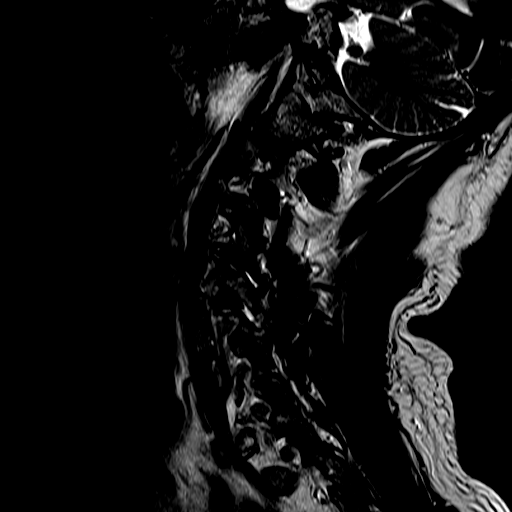

[Series 4: FLAIR · sagittal · 3.0mm · 0.40mm/px · 3 of 13 slices shown]
[im 1/13]
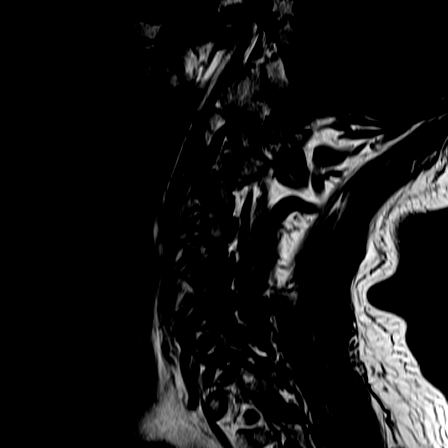
[im 7/13]
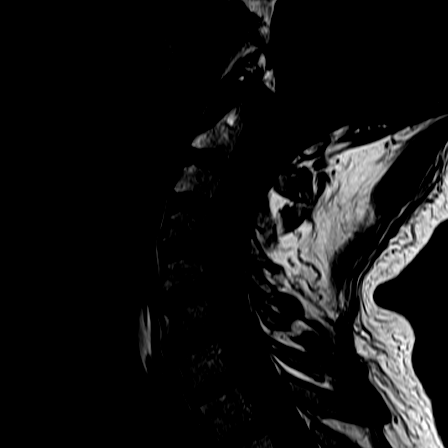
[im 13/13]
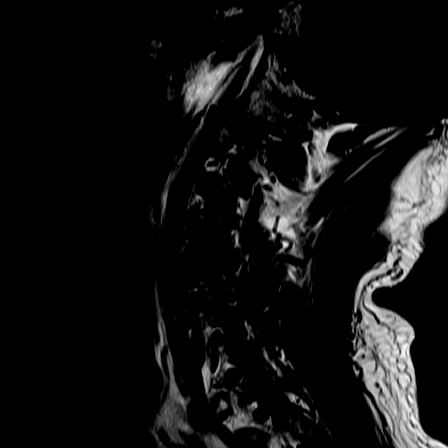

[Series 5: ir sagital · sagittal · 3.0mm · 0.22mm/px · 3 of 13 slices shown]
[im 3/13]
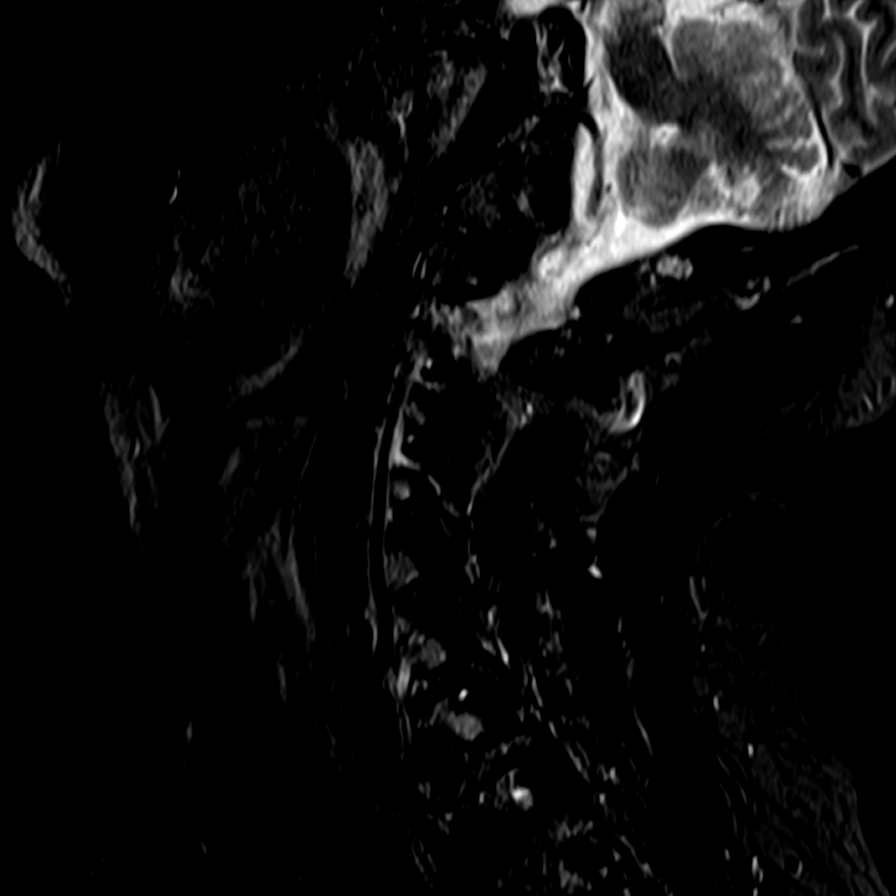
[im 8/13]
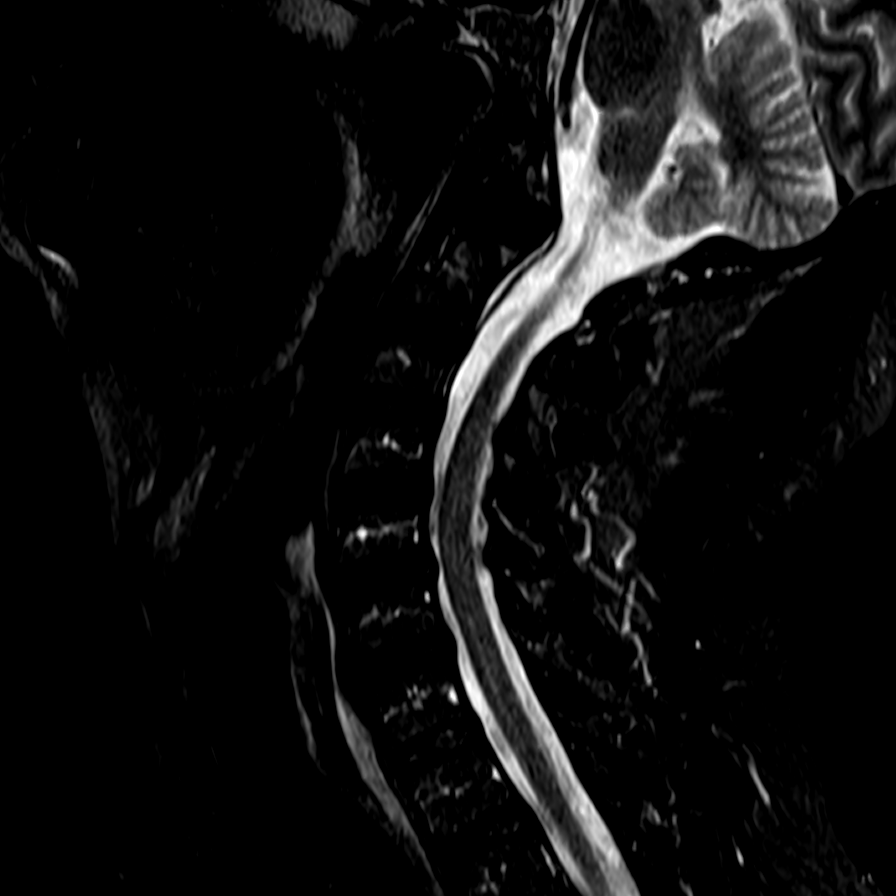
[im 13/13]
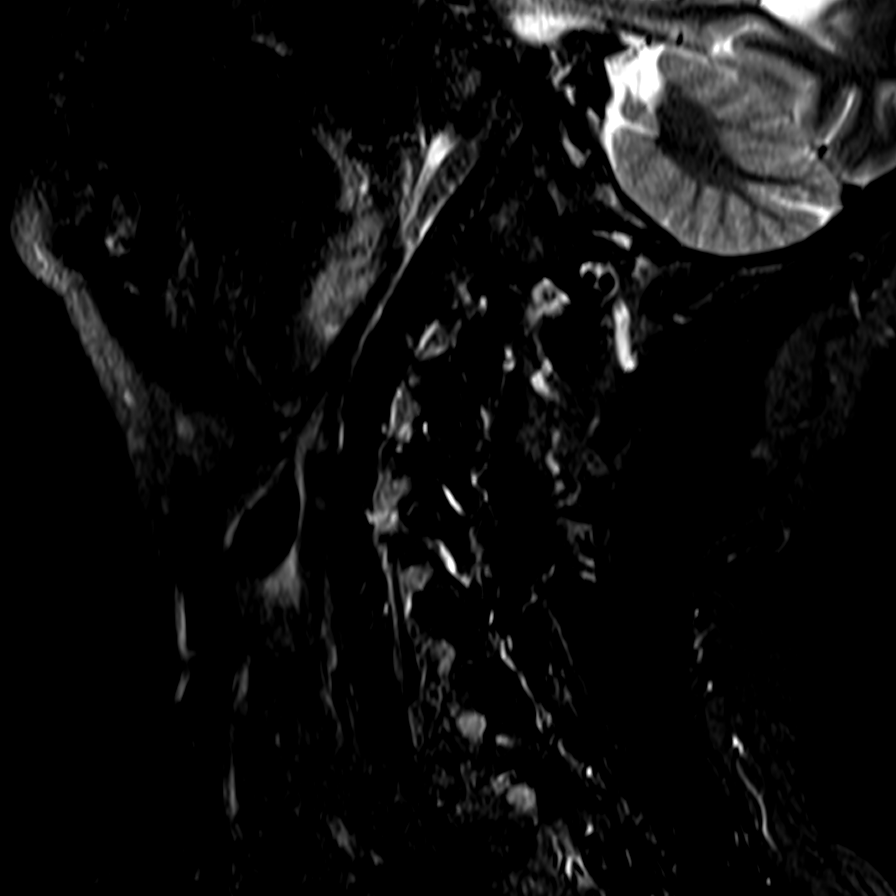

[Series 7: T2 · axial · 3.0mm · 0.20mm/px · z∈[-99,-15]mm · 3 of 36 slices shown (2 of 2)]
[im 5/36]
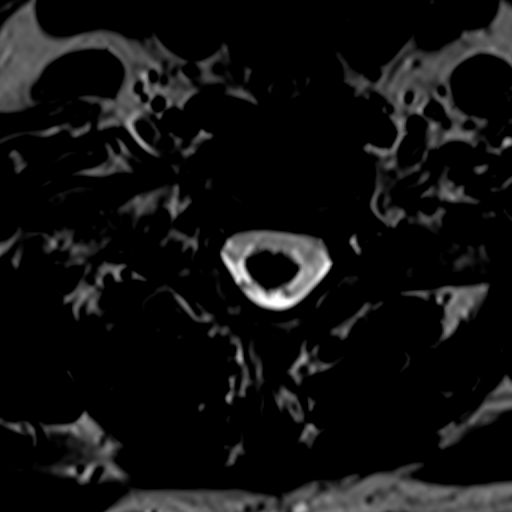
[im 19/36]
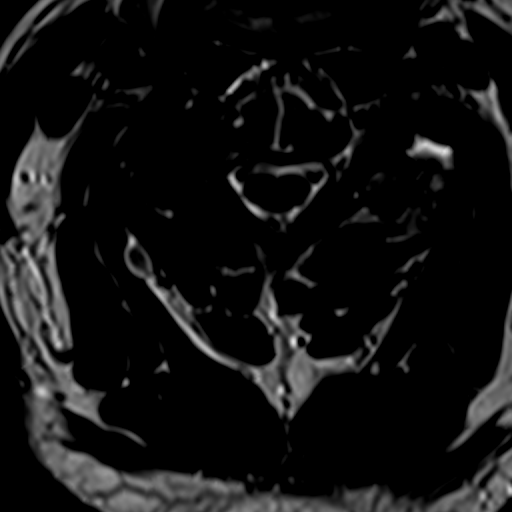
[im 31/36]
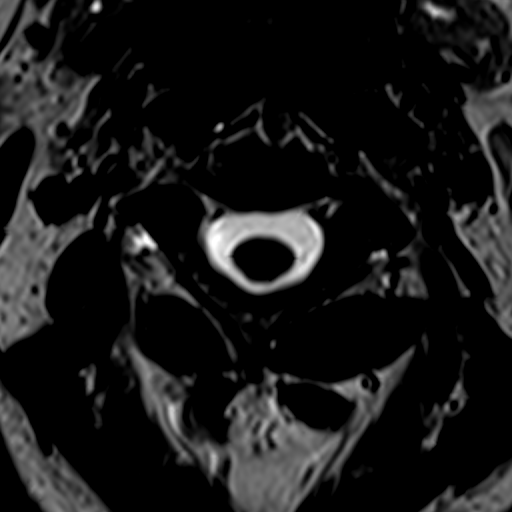

[14 of 48 positions shown; findings below may reference images not displayed]

FINDINGS: Alignment: Cervical lordosis is preserved. Trace anterolisthesis of
C3 on C4 and C4 on C5 appears degenerative and facet mediated. Mild
dextroconvex torticollis with the apex at C6.

Vertebrae: Bone marrow signal shows suppression of normal fatty
marrow. This is a nonspecific finding most commonly associated with
obesity, anemia, cigarette smoking or chronic disease.

Cord: Normal cord signal.  No intramedullary lesions or edema.

Posterior Fossa: Normal.

Vertebral Arteries: Flow voids present bilaterally.

Paraspinal tissues: Normal.

Disc levels:

Diffuse disc desiccation is present, expected for age.

C2-C3: Central canal and foramina patent. Small central disc
protrusion but no stenosis.

C3-C4: Central canal patent. Congenitally short pedicles are present
with superimposed degenerative disease. There is bilateral foraminal
stenosis potentially affecting both C4 nerves. Bilateral facet
arthrosis is present.

C4-C5: RIGHT eccentric disc osteophyte complex. Central canal is
adequately patent. Short pedicles with bilateral uncovertebral
spurring producing bilateral foraminal stenosis which is roughly
symmetric. Right-greater-than-left facet arthrosis, which
contributes to the foraminal stenosis bilaterally. Bilateral facet
effusions or vacuum joints.

C5-C6: Mild central stenosis secondary to broad-based disc bulging
and posterior ligamentum flavum redundancy. LEFT-greater-than- RIGHT
uncovertebral spurring and moderate to severe bilateral facet
arthrosis. LEFT facet effusion or vacuum joint.
Right-greater-than-left foraminal stenosis due to a combination of
facet disease and uncovertebral spurring.

C6-C7: Tiny central protrusion. No resulting central or foraminal
stenosis. RIGHT facet arthrosis.

C7-T1:  Negative.
IMPRESSION: Moderate multilevel cervical spine degenerative disease with
degenerative disc disease and more pronounced multilevel facet
disease. Short pedicles with superimposed degenerative disease
produces multilevel foraminal stenosis detailed above. Central
stenosis is mild and most pronounced at C5-C6.

## 2016-08-11 DIAGNOSIS — M79673 Pain in unspecified foot: Secondary | ICD-10-CM | POA: Diagnosis not present

## 2016-08-22 DIAGNOSIS — M4317 Spondylolisthesis, lumbosacral region: Secondary | ICD-10-CM | POA: Diagnosis not present

## 2016-08-22 DIAGNOSIS — M47816 Spondylosis without myelopathy or radiculopathy, lumbar region: Secondary | ICD-10-CM | POA: Diagnosis not present

## 2016-09-12 ENCOUNTER — Other Ambulatory Visit: Payer: Self-pay

## 2016-09-14 ENCOUNTER — Ambulatory Visit (INDEPENDENT_AMBULATORY_CARE_PROVIDER_SITE_OTHER): Payer: Medicare Other

## 2016-09-14 ENCOUNTER — Ambulatory Visit (INDEPENDENT_AMBULATORY_CARE_PROVIDER_SITE_OTHER): Payer: Medicare Other | Admitting: Podiatry

## 2016-09-14 ENCOUNTER — Encounter: Payer: Self-pay | Admitting: Podiatry

## 2016-09-14 VITALS — BP 136/86 | HR 68 | Resp 16

## 2016-09-14 DIAGNOSIS — M722 Plantar fascial fibromatosis: Secondary | ICD-10-CM

## 2016-09-14 MED ORDER — MELOXICAM 15 MG PO TABS: 15.0000 mg | ORAL_TABLET | Freq: Every day | ORAL | 3 refills | Status: DC

## 2016-09-14 MED ORDER — METHYLPREDNISOLONE 4 MG PO TBPK: ORAL_TABLET | ORAL | 0 refills | Status: DC

## 2016-09-14 NOTE — Patient Instructions (Signed)

## 2016-09-14 NOTE — Progress Notes (Signed)
   Subjective:    Patient ID: Lawrence White, male    DOB: 14-Feb-1951, 66 y.o.   MRN: [id number]  HPI: He presents today with chief complaint of pain to the right heel. He states this than aching now for about 3 months mornings are particularly bad and now the pain is constant. He had back surgery which caused a drop foot on the left side is numb on the left side as well. He states that he doesn't have a lot of control with his left side when he walks.    Review of Systems  Constitutional: Positive for fatigue.  HENT: Positive for tinnitus.   Musculoskeletal: Positive for arthralgias, back pain and gait problem.  Hematological: Bruises/bleeds easily.  All other systems reviewed and are negative.      Objective:   Physical Exam: Vital signs are stable he is alert and oriented 3 pulses are palpable. Neurologic sensorium is intact right mute on the left side with strong plantarflexion and limited in his strength on dorsiflexion left side. Right side is completely normal. Orthopedic evaluation and strength all joints distal to the ankle for range of motion without crepitation. He does have pain on palpation medial calcaneal tubercle of the right heel. Radiographs taken today demonstrate an osseous immature individual thickening of the plantar fascia at its insertion site on the calcaneus right. Cutaneous evaluation does not demonstrate any type of cutaneous wound or lesion.        Assessment & Plan:  We can extensor tendons with foot drop left. Plantar fasciitis right.  Plan: Injected his right heel today with Kenalog and local anesthetic. Placement of plantar fascial brace to be followed by a night splint. Discussed appropriate shoe gear stretching exercises ice therapy and shoe modification started him on a Medrol Dosepak to be followed by meloxicam. We'll follow-up with him in 1 month. May need to consider foot drop brace.

## 2016-10-02 ENCOUNTER — Telehealth: Payer: Self-pay

## 2016-10-02 NOTE — Telephone Encounter (Signed)
Spoke with patient who states that he has had an increase in pain recently. I advised him to continue with ice, brace usage and meloxicam. He is to keep his current appt to see Dr Al CorpusHyatt in 2 weeks.

## 2016-10-17 ENCOUNTER — Ambulatory Visit (INDEPENDENT_AMBULATORY_CARE_PROVIDER_SITE_OTHER): Payer: Medicare Other | Admitting: Podiatry

## 2016-10-17 ENCOUNTER — Encounter: Payer: Self-pay | Admitting: Podiatry

## 2016-10-17 DIAGNOSIS — M722 Plantar fascial fibromatosis: Secondary | ICD-10-CM

## 2016-10-17 NOTE — Progress Notes (Signed)
He presents today for follow-up of his plantar fasciitis shows right heel. He states it is doing much better. He continues his plantar fascia brace and his meloxicam.  Objective: Vital signs are stable he is alert and oriented 3. Pulses are palpable. He has pain on palpation medial calcaneal tubercle of the right heel. It appears to be less painful than it was previously.  Assessment: Slowly resolving plantar fasciitis right.  Plan: I recommended that he continue his meloxicam plantar fascia brace and night splint and I reinjected his right heel. She does not alleviate his symptoms then we will need to consider orthotics.

## 2016-11-21 ENCOUNTER — Ambulatory Visit: Payer: Medicare Other | Admitting: Podiatry

## 2016-11-27 DIAGNOSIS — R7301 Impaired fasting glucose: Secondary | ICD-10-CM | POA: Diagnosis not present

## 2016-11-27 DIAGNOSIS — E782 Mixed hyperlipidemia: Secondary | ICD-10-CM | POA: Diagnosis not present

## 2016-11-27 DIAGNOSIS — I10 Essential (primary) hypertension: Secondary | ICD-10-CM | POA: Diagnosis not present

## 2016-11-29 ENCOUNTER — Other Ambulatory Visit (HOSPITAL_COMMUNITY): Payer: Self-pay | Admitting: Nurse Practitioner

## 2016-11-29 DIAGNOSIS — J302 Other seasonal allergic rhinitis: Secondary | ICD-10-CM | POA: Diagnosis not present

## 2016-11-29 DIAGNOSIS — M47816 Spondylosis without myelopathy or radiculopathy, lumbar region: Secondary | ICD-10-CM | POA: Diagnosis not present

## 2016-11-29 DIAGNOSIS — G9009 Other idiopathic peripheral autonomic neuropathy: Secondary | ICD-10-CM | POA: Diagnosis not present

## 2016-11-29 DIAGNOSIS — E782 Mixed hyperlipidemia: Secondary | ICD-10-CM | POA: Diagnosis not present

## 2016-11-29 DIAGNOSIS — M48061 Spinal stenosis, lumbar region without neurogenic claudication: Secondary | ICD-10-CM | POA: Diagnosis not present

## 2016-11-29 DIAGNOSIS — K219 Gastro-esophageal reflux disease without esophagitis: Secondary | ICD-10-CM | POA: Diagnosis not present

## 2016-11-29 DIAGNOSIS — M4317 Spondylolisthesis, lumbosacral region: Secondary | ICD-10-CM | POA: Diagnosis not present

## 2016-11-29 DIAGNOSIS — M722 Plantar fascial fibromatosis: Secondary | ICD-10-CM | POA: Diagnosis not present

## 2016-11-29 DIAGNOSIS — Z23 Encounter for immunization: Secondary | ICD-10-CM | POA: Diagnosis not present

## 2016-11-29 DIAGNOSIS — I10 Essential (primary) hypertension: Secondary | ICD-10-CM | POA: Diagnosis not present

## 2016-11-29 DIAGNOSIS — Z6827 Body mass index (BMI) 27.0-27.9, adult: Secondary | ICD-10-CM | POA: Diagnosis not present

## 2016-12-07 ENCOUNTER — Ambulatory Visit (HOSPITAL_COMMUNITY)
Admission: RE | Admit: 2016-12-07 | Discharge: 2016-12-07 | Disposition: A | Discharge status: Home / Self Care | Payer: Medicare Other | Source: Ambulatory Visit | Attending: Nurse Practitioner | Admitting: Nurse Practitioner

## 2016-12-07 DIAGNOSIS — M47816 Spondylosis without myelopathy or radiculopathy, lumbar region: Secondary | ICD-10-CM

## 2016-12-11 DIAGNOSIS — M47816 Spondylosis without myelopathy or radiculopathy, lumbar region: Secondary | ICD-10-CM | POA: Diagnosis not present

## 2017-03-13 DIAGNOSIS — M47816 Spondylosis without myelopathy or radiculopathy, lumbar region: Secondary | ICD-10-CM | POA: Diagnosis not present

## 2017-04-30 DIAGNOSIS — M25561 Pain in right knee: Secondary | ICD-10-CM | POA: Diagnosis not present

## 2017-04-30 DIAGNOSIS — R2241 Localized swelling, mass and lump, right lower limb: Secondary | ICD-10-CM | POA: Diagnosis not present

## 2017-04-30 DIAGNOSIS — M79604 Pain in right leg: Secondary | ICD-10-CM | POA: Diagnosis not present

## 2017-05-08 DIAGNOSIS — M25561 Pain in right knee: Secondary | ICD-10-CM | POA: Diagnosis not present

## 2017-05-10 DIAGNOSIS — M25561 Pain in right knee: Secondary | ICD-10-CM | POA: Diagnosis not present

## 2017-05-21 DIAGNOSIS — M25561 Pain in right knee: Secondary | ICD-10-CM | POA: Diagnosis not present

## 2017-05-21 DIAGNOSIS — M1711 Unilateral primary osteoarthritis, right knee: Secondary | ICD-10-CM | POA: Diagnosis not present

## 2017-05-21 DIAGNOSIS — S83241D Other tear of medial meniscus, current injury, right knee, subsequent encounter: Secondary | ICD-10-CM | POA: Diagnosis not present

## 2017-05-24 ENCOUNTER — Other Ambulatory Visit (HOSPITAL_COMMUNITY): Payer: Self-pay | Admitting: Orthopedic Surgery

## 2017-05-24 ENCOUNTER — Ambulatory Visit (HOSPITAL_COMMUNITY)
Admission: RE | Admit: 2017-05-24 | Discharge: 2017-05-24 | Disposition: A | Discharge status: Home / Self Care | Payer: Medicare Other | Source: Ambulatory Visit | Attending: Cardiovascular Disease | Admitting: Cardiovascular Disease

## 2017-05-24 DIAGNOSIS — M7989 Other specified soft tissue disorders: Principal | ICD-10-CM

## 2017-05-24 DIAGNOSIS — M79604 Pain in right leg: Secondary | ICD-10-CM

## 2017-05-24 DIAGNOSIS — M25561 Pain in right knee: Secondary | ICD-10-CM | POA: Diagnosis not present

## 2017-05-31 DIAGNOSIS — M47816 Spondylosis without myelopathy or radiculopathy, lumbar region: Secondary | ICD-10-CM | POA: Diagnosis not present

## 2017-05-31 DIAGNOSIS — M4317 Spondylolisthesis, lumbosacral region: Secondary | ICD-10-CM | POA: Diagnosis not present

## 2017-06-06 DIAGNOSIS — Y999 Unspecified external cause status: Secondary | ICD-10-CM | POA: Diagnosis not present

## 2017-06-06 DIAGNOSIS — M948X6 Other specified disorders of cartilage, lower leg: Secondary | ICD-10-CM | POA: Diagnosis not present

## 2017-06-06 DIAGNOSIS — S83241A Other tear of medial meniscus, current injury, right knee, initial encounter: Secondary | ICD-10-CM | POA: Diagnosis not present

## 2017-06-06 DIAGNOSIS — X58XXXA Exposure to other specified factors, initial encounter: Secondary | ICD-10-CM | POA: Diagnosis not present

## 2017-06-06 DIAGNOSIS — S83281A Other tear of lateral meniscus, current injury, right knee, initial encounter: Secondary | ICD-10-CM | POA: Diagnosis not present

## 2017-06-06 DIAGNOSIS — M659 Synovitis and tenosynovitis, unspecified: Secondary | ICD-10-CM | POA: Diagnosis not present

## 2017-06-06 DIAGNOSIS — G8918 Other acute postprocedural pain: Secondary | ICD-10-CM | POA: Diagnosis not present

## 2017-06-06 DIAGNOSIS — M1711 Unilateral primary osteoarthritis, right knee: Secondary | ICD-10-CM | POA: Diagnosis not present

## 2017-06-11 DIAGNOSIS — M6281 Muscle weakness (generalized): Secondary | ICD-10-CM | POA: Diagnosis not present

## 2017-06-11 DIAGNOSIS — R262 Difficulty in walking, not elsewhere classified: Secondary | ICD-10-CM | POA: Diagnosis not present

## 2017-06-11 DIAGNOSIS — M25661 Stiffness of right knee, not elsewhere classified: Secondary | ICD-10-CM | POA: Diagnosis not present

## 2017-06-11 DIAGNOSIS — M25561 Pain in right knee: Secondary | ICD-10-CM | POA: Diagnosis not present

## 2017-06-14 DIAGNOSIS — M25571 Pain in right ankle and joints of right foot: Secondary | ICD-10-CM | POA: Diagnosis not present

## 2017-06-14 DIAGNOSIS — M25561 Pain in right knee: Secondary | ICD-10-CM | POA: Diagnosis not present

## 2017-06-14 DIAGNOSIS — M25661 Stiffness of right knee, not elsewhere classified: Secondary | ICD-10-CM | POA: Diagnosis not present

## 2017-06-14 DIAGNOSIS — M6281 Muscle weakness (generalized): Secondary | ICD-10-CM | POA: Diagnosis not present

## 2017-06-14 DIAGNOSIS — R262 Difficulty in walking, not elsewhere classified: Secondary | ICD-10-CM | POA: Diagnosis not present

## 2017-06-18 DIAGNOSIS — R7301 Impaired fasting glucose: Secondary | ICD-10-CM | POA: Diagnosis not present

## 2017-06-18 DIAGNOSIS — M25661 Stiffness of right knee, not elsewhere classified: Secondary | ICD-10-CM | POA: Diagnosis not present

## 2017-06-18 DIAGNOSIS — R262 Difficulty in walking, not elsewhere classified: Secondary | ICD-10-CM | POA: Diagnosis not present

## 2017-06-18 DIAGNOSIS — Z125 Encounter for screening for malignant neoplasm of prostate: Secondary | ICD-10-CM | POA: Diagnosis not present

## 2017-06-18 DIAGNOSIS — I10 Essential (primary) hypertension: Secondary | ICD-10-CM | POA: Diagnosis not present

## 2017-06-18 DIAGNOSIS — E782 Mixed hyperlipidemia: Secondary | ICD-10-CM | POA: Diagnosis not present

## 2017-06-18 DIAGNOSIS — M25561 Pain in right knee: Secondary | ICD-10-CM | POA: Diagnosis not present

## 2017-06-18 DIAGNOSIS — M79604 Pain in right leg: Secondary | ICD-10-CM | POA: Diagnosis not present

## 2017-06-18 DIAGNOSIS — M6281 Muscle weakness (generalized): Secondary | ICD-10-CM | POA: Diagnosis not present

## 2017-06-18 DIAGNOSIS — R2241 Localized swelling, mass and lump, right lower limb: Secondary | ICD-10-CM | POA: Diagnosis not present

## 2017-06-20 DIAGNOSIS — M25661 Stiffness of right knee, not elsewhere classified: Secondary | ICD-10-CM | POA: Diagnosis not present

## 2017-06-20 DIAGNOSIS — R262 Difficulty in walking, not elsewhere classified: Secondary | ICD-10-CM | POA: Diagnosis not present

## 2017-06-20 DIAGNOSIS — M6281 Muscle weakness (generalized): Secondary | ICD-10-CM | POA: Diagnosis not present

## 2017-06-20 DIAGNOSIS — M25561 Pain in right knee: Secondary | ICD-10-CM | POA: Diagnosis not present

## 2017-06-22 DIAGNOSIS — K219 Gastro-esophageal reflux disease without esophagitis: Secondary | ICD-10-CM | POA: Diagnosis not present

## 2017-06-22 DIAGNOSIS — I10 Essential (primary) hypertension: Secondary | ICD-10-CM | POA: Diagnosis not present

## 2017-06-22 DIAGNOSIS — M722 Plantar fascial fibromatosis: Secondary | ICD-10-CM | POA: Diagnosis not present

## 2017-06-22 DIAGNOSIS — M1990 Unspecified osteoarthritis, unspecified site: Secondary | ICD-10-CM | POA: Diagnosis not present

## 2017-06-22 DIAGNOSIS — R7301 Impaired fasting glucose: Secondary | ICD-10-CM | POA: Diagnosis not present

## 2017-06-22 DIAGNOSIS — J302 Other seasonal allergic rhinitis: Secondary | ICD-10-CM | POA: Diagnosis not present

## 2017-06-22 DIAGNOSIS — G9009 Other idiopathic peripheral autonomic neuropathy: Secondary | ICD-10-CM | POA: Diagnosis not present

## 2017-06-22 DIAGNOSIS — S83241D Other tear of medial meniscus, current injury, right knee, subsequent encounter: Secondary | ICD-10-CM | POA: Diagnosis not present

## 2017-06-22 DIAGNOSIS — E782 Mixed hyperlipidemia: Secondary | ICD-10-CM | POA: Diagnosis not present

## 2017-06-22 DIAGNOSIS — M4326 Fusion of spine, lumbar region: Secondary | ICD-10-CM | POA: Diagnosis not present

## 2017-06-22 DIAGNOSIS — Z6829 Body mass index (BMI) 29.0-29.9, adult: Secondary | ICD-10-CM | POA: Diagnosis not present

## 2017-06-25 DIAGNOSIS — M25661 Stiffness of right knee, not elsewhere classified: Secondary | ICD-10-CM | POA: Diagnosis not present

## 2017-06-25 DIAGNOSIS — R262 Difficulty in walking, not elsewhere classified: Secondary | ICD-10-CM | POA: Diagnosis not present

## 2017-06-25 DIAGNOSIS — M6281 Muscle weakness (generalized): Secondary | ICD-10-CM | POA: Diagnosis not present

## 2017-06-25 DIAGNOSIS — M25561 Pain in right knee: Secondary | ICD-10-CM | POA: Diagnosis not present

## 2017-06-27 DIAGNOSIS — R262 Difficulty in walking, not elsewhere classified: Secondary | ICD-10-CM | POA: Diagnosis not present

## 2017-06-27 DIAGNOSIS — M25661 Stiffness of right knee, not elsewhere classified: Secondary | ICD-10-CM | POA: Diagnosis not present

## 2017-06-27 DIAGNOSIS — M25561 Pain in right knee: Secondary | ICD-10-CM | POA: Diagnosis not present

## 2017-06-27 DIAGNOSIS — M6281 Muscle weakness (generalized): Secondary | ICD-10-CM | POA: Diagnosis not present

## 2017-07-03 ENCOUNTER — Encounter: Payer: Self-pay | Admitting: Podiatry

## 2017-07-03 ENCOUNTER — Encounter

## 2017-07-03 ENCOUNTER — Ambulatory Visit (INDEPENDENT_AMBULATORY_CARE_PROVIDER_SITE_OTHER): Payer: Medicare Other | Admitting: Podiatry

## 2017-07-03 DIAGNOSIS — M25561 Pain in right knee: Secondary | ICD-10-CM | POA: Diagnosis not present

## 2017-07-03 DIAGNOSIS — M25661 Stiffness of right knee, not elsewhere classified: Secondary | ICD-10-CM | POA: Diagnosis not present

## 2017-07-03 DIAGNOSIS — M722 Plantar fascial fibromatosis: Secondary | ICD-10-CM | POA: Diagnosis not present

## 2017-07-03 DIAGNOSIS — R262 Difficulty in walking, not elsewhere classified: Secondary | ICD-10-CM | POA: Diagnosis not present

## 2017-07-03 DIAGNOSIS — M6281 Muscle weakness (generalized): Secondary | ICD-10-CM | POA: Diagnosis not present

## 2017-07-03 NOTE — Progress Notes (Signed)
He presents today for follow-up of his right foot.  States that he had knee surgery just about a month ago and started limping on his right foot and made the heel started hurting again.  He states that his knee is doing better but now his heel is killing him.  Objective: Vital signs are stable alert and oriented x3.  Pulses are palpable.  Still has severe pain on palpation medial calcaneal tubercle of the right heel.  There is no swelling in the calf.  Assessment: Chronic plantar fasciitis right heel.  Plan: Injected 20 mg of Kenalog 5 mg Marcaine to the medial aspect of the right heel.  Tolerated procedure well without complications he will continue the meloxicam regular basis.  Follow-up with him in 1 month if necessary.

## 2017-07-05 DIAGNOSIS — M6281 Muscle weakness (generalized): Secondary | ICD-10-CM | POA: Diagnosis not present

## 2017-07-05 DIAGNOSIS — R262 Difficulty in walking, not elsewhere classified: Secondary | ICD-10-CM | POA: Diagnosis not present

## 2017-07-05 DIAGNOSIS — M25661 Stiffness of right knee, not elsewhere classified: Secondary | ICD-10-CM | POA: Diagnosis not present

## 2017-07-05 DIAGNOSIS — M25561 Pain in right knee: Secondary | ICD-10-CM | POA: Diagnosis not present

## 2017-07-09 DIAGNOSIS — M25661 Stiffness of right knee, not elsewhere classified: Secondary | ICD-10-CM | POA: Diagnosis not present

## 2017-07-09 DIAGNOSIS — M25561 Pain in right knee: Secondary | ICD-10-CM | POA: Diagnosis not present

## 2017-07-09 DIAGNOSIS — R262 Difficulty in walking, not elsewhere classified: Secondary | ICD-10-CM | POA: Diagnosis not present

## 2017-07-09 DIAGNOSIS — M6281 Muscle weakness (generalized): Secondary | ICD-10-CM | POA: Diagnosis not present

## 2017-07-12 DIAGNOSIS — M6281 Muscle weakness (generalized): Secondary | ICD-10-CM | POA: Diagnosis not present

## 2017-07-12 DIAGNOSIS — M25561 Pain in right knee: Secondary | ICD-10-CM | POA: Diagnosis not present

## 2017-07-12 DIAGNOSIS — M25661 Stiffness of right knee, not elsewhere classified: Secondary | ICD-10-CM | POA: Diagnosis not present

## 2017-07-12 DIAGNOSIS — R262 Difficulty in walking, not elsewhere classified: Secondary | ICD-10-CM | POA: Diagnosis not present

## 2017-07-26 DIAGNOSIS — M1711 Unilateral primary osteoarthritis, right knee: Secondary | ICD-10-CM | POA: Diagnosis not present

## 2017-08-02 DIAGNOSIS — M1711 Unilateral primary osteoarthritis, right knee: Secondary | ICD-10-CM | POA: Diagnosis not present

## 2017-08-07 ENCOUNTER — Ambulatory Visit: Payer: Medicare Other | Admitting: Podiatry

## 2017-08-08 DIAGNOSIS — M1990 Unspecified osteoarthritis, unspecified site: Secondary | ICD-10-CM | POA: Diagnosis not present

## 2017-08-08 DIAGNOSIS — Z6829 Body mass index (BMI) 29.0-29.9, adult: Secondary | ICD-10-CM | POA: Diagnosis not present

## 2017-08-08 DIAGNOSIS — R2241 Localized swelling, mass and lump, right lower limb: Secondary | ICD-10-CM | POA: Diagnosis not present

## 2017-08-08 DIAGNOSIS — R7301 Impaired fasting glucose: Secondary | ICD-10-CM | POA: Diagnosis not present

## 2017-08-08 DIAGNOSIS — G9009 Other idiopathic peripheral autonomic neuropathy: Secondary | ICD-10-CM | POA: Diagnosis not present

## 2017-08-08 DIAGNOSIS — J302 Other seasonal allergic rhinitis: Secondary | ICD-10-CM | POA: Diagnosis not present

## 2017-08-08 DIAGNOSIS — E782 Mixed hyperlipidemia: Secondary | ICD-10-CM | POA: Diagnosis not present

## 2017-08-08 DIAGNOSIS — K219 Gastro-esophageal reflux disease without esophagitis: Secondary | ICD-10-CM | POA: Diagnosis not present

## 2017-08-08 DIAGNOSIS — M4326 Fusion of spine, lumbar region: Secondary | ICD-10-CM | POA: Diagnosis not present

## 2017-08-08 DIAGNOSIS — M79604 Pain in right leg: Secondary | ICD-10-CM | POA: Diagnosis not present

## 2017-08-08 DIAGNOSIS — J019 Acute sinusitis, unspecified: Secondary | ICD-10-CM | POA: Diagnosis not present

## 2017-08-08 DIAGNOSIS — I10 Essential (primary) hypertension: Secondary | ICD-10-CM | POA: Diagnosis not present

## 2017-08-13 DIAGNOSIS — J060 Acute laryngopharyngitis: Secondary | ICD-10-CM | POA: Diagnosis not present

## 2017-08-13 DIAGNOSIS — H6502 Acute serous otitis media, left ear: Secondary | ICD-10-CM | POA: Diagnosis not present

## 2017-08-24 DIAGNOSIS — M1711 Unilateral primary osteoarthritis, right knee: Secondary | ICD-10-CM | POA: Diagnosis not present

## 2017-08-27 DIAGNOSIS — Z6828 Body mass index (BMI) 28.0-28.9, adult: Secondary | ICD-10-CM | POA: Diagnosis not present

## 2017-08-27 DIAGNOSIS — H9190 Unspecified hearing loss, unspecified ear: Secondary | ICD-10-CM | POA: Diagnosis not present

## 2017-08-27 DIAGNOSIS — H9209 Otalgia, unspecified ear: Secondary | ICD-10-CM | POA: Diagnosis not present

## 2017-09-05 DIAGNOSIS — M47816 Spondylosis without myelopathy or radiculopathy, lumbar region: Secondary | ICD-10-CM | POA: Diagnosis not present

## 2017-09-05 DIAGNOSIS — M4317 Spondylolisthesis, lumbosacral region: Secondary | ICD-10-CM | POA: Diagnosis not present

## 2017-10-01 ENCOUNTER — Ambulatory Visit (INDEPENDENT_AMBULATORY_CARE_PROVIDER_SITE_OTHER): Payer: Medicare Other | Admitting: Otolaryngology

## 2017-10-01 DIAGNOSIS — H903 Sensorineural hearing loss, bilateral: Secondary | ICD-10-CM

## 2017-10-01 DIAGNOSIS — H6983 Other specified disorders of Eustachian tube, bilateral: Secondary | ICD-10-CM

## 2017-10-01 DIAGNOSIS — H9012 Conductive hearing loss, unilateral, left ear, with unrestricted hearing on the contralateral side: Secondary | ICD-10-CM

## 2017-10-29 ENCOUNTER — Ambulatory Visit (INDEPENDENT_AMBULATORY_CARE_PROVIDER_SITE_OTHER): Payer: Medicare Other | Admitting: Otolaryngology

## 2017-10-29 DIAGNOSIS — H903 Sensorineural hearing loss, bilateral: Secondary | ICD-10-CM

## 2017-10-29 DIAGNOSIS — H6122 Impacted cerumen, left ear: Secondary | ICD-10-CM | POA: Diagnosis not present

## 2017-10-29 DIAGNOSIS — H6983 Other specified disorders of Eustachian tube, bilateral: Secondary | ICD-10-CM

## 2017-12-07 DIAGNOSIS — M47816 Spondylosis without myelopathy or radiculopathy, lumbar region: Secondary | ICD-10-CM | POA: Diagnosis not present

## 2017-12-07 DIAGNOSIS — M4317 Spondylolisthesis, lumbosacral region: Secondary | ICD-10-CM | POA: Diagnosis not present

## 2017-12-31 DIAGNOSIS — E782 Mixed hyperlipidemia: Secondary | ICD-10-CM | POA: Diagnosis not present

## 2017-12-31 DIAGNOSIS — R7301 Impaired fasting glucose: Secondary | ICD-10-CM | POA: Diagnosis not present

## 2017-12-31 DIAGNOSIS — I10 Essential (primary) hypertension: Secondary | ICD-10-CM | POA: Diagnosis not present

## 2018-01-02 DIAGNOSIS — M722 Plantar fascial fibromatosis: Secondary | ICD-10-CM | POA: Diagnosis not present

## 2018-01-02 DIAGNOSIS — Z23 Encounter for immunization: Secondary | ICD-10-CM | POA: Diagnosis not present

## 2018-01-02 DIAGNOSIS — M4326 Fusion of spine, lumbar region: Secondary | ICD-10-CM | POA: Diagnosis not present

## 2018-01-02 DIAGNOSIS — I10 Essential (primary) hypertension: Secondary | ICD-10-CM | POA: Diagnosis not present

## 2018-01-02 DIAGNOSIS — K219 Gastro-esophageal reflux disease without esophagitis: Secondary | ICD-10-CM | POA: Diagnosis not present

## 2018-01-02 DIAGNOSIS — J302 Other seasonal allergic rhinitis: Secondary | ICD-10-CM | POA: Diagnosis not present

## 2018-01-02 DIAGNOSIS — S83241D Other tear of medial meniscus, current injury, right knee, subsequent encounter: Secondary | ICD-10-CM | POA: Diagnosis not present

## 2018-01-02 DIAGNOSIS — R7301 Impaired fasting glucose: Secondary | ICD-10-CM | POA: Diagnosis not present

## 2018-01-02 DIAGNOSIS — G9009 Other idiopathic peripheral autonomic neuropathy: Secondary | ICD-10-CM | POA: Diagnosis not present

## 2018-01-02 DIAGNOSIS — Z0000 Encounter for general adult medical examination without abnormal findings: Secondary | ICD-10-CM | POA: Diagnosis not present

## 2018-01-02 DIAGNOSIS — E782 Mixed hyperlipidemia: Secondary | ICD-10-CM | POA: Diagnosis not present

## 2018-01-02 DIAGNOSIS — M1990 Unspecified osteoarthritis, unspecified site: Secondary | ICD-10-CM | POA: Diagnosis not present

## 2018-01-08 DIAGNOSIS — H35361 Drusen (degenerative) of macula, right eye: Secondary | ICD-10-CM | POA: Diagnosis not present

## 2018-01-08 DIAGNOSIS — H43393 Other vitreous opacities, bilateral: Secondary | ICD-10-CM | POA: Diagnosis not present

## 2018-01-08 DIAGNOSIS — H2513 Age-related nuclear cataract, bilateral: Secondary | ICD-10-CM | POA: Diagnosis not present

## 2018-01-08 DIAGNOSIS — H524 Presbyopia: Secondary | ICD-10-CM | POA: Diagnosis not present

## 2018-03-15 DIAGNOSIS — M4317 Spondylolisthesis, lumbosacral region: Secondary | ICD-10-CM | POA: Diagnosis not present

## 2018-03-15 DIAGNOSIS — M47816 Spondylosis without myelopathy or radiculopathy, lumbar region: Secondary | ICD-10-CM | POA: Diagnosis not present

## 2018-04-22 DIAGNOSIS — M1711 Unilateral primary osteoarthritis, right knee: Secondary | ICD-10-CM | POA: Diagnosis not present

## 2018-04-29 DIAGNOSIS — M1711 Unilateral primary osteoarthritis, right knee: Secondary | ICD-10-CM | POA: Diagnosis not present

## 2018-05-06 DIAGNOSIS — M1711 Unilateral primary osteoarthritis, right knee: Secondary | ICD-10-CM | POA: Diagnosis not present

## 2018-05-06 DIAGNOSIS — M25512 Pain in left shoulder: Secondary | ICD-10-CM | POA: Diagnosis not present

## 2018-05-06 DIAGNOSIS — M25511 Pain in right shoulder: Secondary | ICD-10-CM | POA: Diagnosis not present

## 2018-05-07 ENCOUNTER — Encounter: Payer: Self-pay | Admitting: Podiatry

## 2018-05-07 ENCOUNTER — Ambulatory Visit (INDEPENDENT_AMBULATORY_CARE_PROVIDER_SITE_OTHER): Payer: Medicare Other

## 2018-05-07 ENCOUNTER — Other Ambulatory Visit: Payer: Self-pay

## 2018-05-07 ENCOUNTER — Ambulatory Visit (INDEPENDENT_AMBULATORY_CARE_PROVIDER_SITE_OTHER): Payer: Medicare Other | Admitting: Podiatry

## 2018-05-07 DIAGNOSIS — M778 Other enthesopathies, not elsewhere classified: Secondary | ICD-10-CM

## 2018-05-07 DIAGNOSIS — M47812 Spondylosis without myelopathy or radiculopathy, cervical region: Secondary | ICD-10-CM | POA: Insufficient documentation

## 2018-05-07 DIAGNOSIS — M779 Enthesopathy, unspecified: Secondary | ICD-10-CM

## 2018-05-07 DIAGNOSIS — M4317 Spondylolisthesis, lumbosacral region: Secondary | ICD-10-CM | POA: Insufficient documentation

## 2018-05-07 NOTE — Progress Notes (Signed)
He presents today chief complaint of a painful forefoot bilaterally.  He states that he is getting spasms in his big toe area bilaterally but his right heel is doing much better.  Objective: Vital signs are stable alert and oriented x3.  Pulses are palpable.  Neurologic sensorium is intact deep tendon reflexes are intact muscle pain is normal symmetrical.  He has no range of motion dorsiflexion plantarflexion of the first toes bilaterally.  It is moderately tender on palpation.  Radiographs taken today demonstrate bone to bone joint space narrowing subchondral sclerosis and eburnation dorsal spurring is noted.  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  No open lesions or wounds are noted.  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  Plan: Discussed etiology pathology conservative versus surgical therapies at this point I injected around the joint today with 20 mg Kenalog 5 mg Marcaine point maximal tenderness bilaterally.  He tolerated the procedure well we discussed possible need for surgical intervention discussed appropriate shoe gear stretching exercise ice therapy and shoe modifications.  Follow-up with him in 6 weeks or so if is not improved then we will consider surgery.

## 2018-06-18 ENCOUNTER — Ambulatory Visit: Payer: Medicare Other | Admitting: Podiatry

## 2018-07-03 DIAGNOSIS — R7301 Impaired fasting glucose: Secondary | ICD-10-CM | POA: Diagnosis not present

## 2018-07-03 DIAGNOSIS — E782 Mixed hyperlipidemia: Secondary | ICD-10-CM | POA: Diagnosis not present

## 2018-07-03 DIAGNOSIS — I10 Essential (primary) hypertension: Secondary | ICD-10-CM | POA: Diagnosis not present

## 2018-07-10 DIAGNOSIS — M722 Plantar fascial fibromatosis: Secondary | ICD-10-CM | POA: Diagnosis not present

## 2018-07-10 DIAGNOSIS — M25512 Pain in left shoulder: Secondary | ICD-10-CM | POA: Diagnosis not present

## 2018-07-10 DIAGNOSIS — M25511 Pain in right shoulder: Secondary | ICD-10-CM | POA: Diagnosis not present

## 2018-07-10 DIAGNOSIS — E782 Mixed hyperlipidemia: Secondary | ICD-10-CM | POA: Diagnosis not present

## 2018-07-10 DIAGNOSIS — J309 Allergic rhinitis, unspecified: Secondary | ICD-10-CM | POA: Diagnosis not present

## 2018-07-10 DIAGNOSIS — M1711 Unilateral primary osteoarthritis, right knee: Secondary | ICD-10-CM | POA: Diagnosis not present

## 2018-07-10 DIAGNOSIS — K219 Gastro-esophageal reflux disease without esophagitis: Secondary | ICD-10-CM | POA: Diagnosis not present

## 2018-07-10 DIAGNOSIS — I10 Essential (primary) hypertension: Secondary | ICD-10-CM | POA: Diagnosis not present

## 2018-07-10 DIAGNOSIS — R7301 Impaired fasting glucose: Secondary | ICD-10-CM | POA: Diagnosis not present

## 2018-07-10 DIAGNOSIS — J302 Other seasonal allergic rhinitis: Secondary | ICD-10-CM | POA: Diagnosis not present

## 2018-07-10 DIAGNOSIS — M4306 Spondylolysis, lumbar region: Secondary | ICD-10-CM | POA: Diagnosis not present

## 2018-08-08 DIAGNOSIS — M25561 Pain in right knee: Secondary | ICD-10-CM | POA: Diagnosis not present

## 2018-08-08 DIAGNOSIS — M25511 Pain in right shoulder: Secondary | ICD-10-CM | POA: Diagnosis not present

## 2018-08-08 DIAGNOSIS — M25512 Pain in left shoulder: Secondary | ICD-10-CM | POA: Diagnosis not present

## 2018-09-20 ENCOUNTER — Other Ambulatory Visit: Payer: Self-pay

## 2018-11-07 DIAGNOSIS — E782 Mixed hyperlipidemia: Secondary | ICD-10-CM | POA: Diagnosis not present

## 2018-11-07 DIAGNOSIS — I10 Essential (primary) hypertension: Secondary | ICD-10-CM | POA: Diagnosis not present

## 2018-11-07 DIAGNOSIS — R7301 Impaired fasting glucose: Secondary | ICD-10-CM | POA: Diagnosis not present

## 2018-11-11 DIAGNOSIS — M25511 Pain in right shoulder: Secondary | ICD-10-CM | POA: Diagnosis not present

## 2018-11-11 DIAGNOSIS — M25512 Pain in left shoulder: Secondary | ICD-10-CM | POA: Diagnosis not present

## 2018-11-11 DIAGNOSIS — M1711 Unilateral primary osteoarthritis, right knee: Secondary | ICD-10-CM | POA: Diagnosis not present

## 2018-11-12 DIAGNOSIS — I10 Essential (primary) hypertension: Secondary | ICD-10-CM | POA: Diagnosis not present

## 2018-11-12 DIAGNOSIS — M4306 Spondylolysis, lumbar region: Secondary | ICD-10-CM | POA: Diagnosis not present

## 2018-11-12 DIAGNOSIS — M1711 Unilateral primary osteoarthritis, right knee: Secondary | ICD-10-CM | POA: Diagnosis not present

## 2018-11-12 DIAGNOSIS — K219 Gastro-esophageal reflux disease without esophagitis: Secondary | ICD-10-CM | POA: Diagnosis not present

## 2018-11-12 DIAGNOSIS — M4326 Fusion of spine, lumbar region: Secondary | ICD-10-CM | POA: Diagnosis not present

## 2018-11-12 DIAGNOSIS — G9009 Other idiopathic peripheral autonomic neuropathy: Secondary | ICD-10-CM | POA: Diagnosis not present

## 2018-11-12 DIAGNOSIS — M25512 Pain in left shoulder: Secondary | ICD-10-CM | POA: Diagnosis not present

## 2018-11-12 DIAGNOSIS — E782 Mixed hyperlipidemia: Secondary | ICD-10-CM | POA: Diagnosis not present

## 2018-11-12 DIAGNOSIS — R7301 Impaired fasting glucose: Secondary | ICD-10-CM | POA: Diagnosis not present

## 2018-11-12 DIAGNOSIS — M25511 Pain in right shoulder: Secondary | ICD-10-CM | POA: Diagnosis not present

## 2018-11-12 DIAGNOSIS — J302 Other seasonal allergic rhinitis: Secondary | ICD-10-CM | POA: Diagnosis not present

## 2018-11-18 DIAGNOSIS — M1711 Unilateral primary osteoarthritis, right knee: Secondary | ICD-10-CM | POA: Diagnosis not present

## 2018-11-19 ENCOUNTER — Encounter (INDEPENDENT_AMBULATORY_CARE_PROVIDER_SITE_OTHER): Payer: Self-pay | Admitting: *Deleted

## 2018-11-22 ENCOUNTER — Encounter: Payer: Self-pay | Admitting: Internal Medicine

## 2018-11-25 DIAGNOSIS — M1711 Unilateral primary osteoarthritis, right knee: Secondary | ICD-10-CM | POA: Diagnosis not present

## 2018-11-28 DIAGNOSIS — Z23 Encounter for immunization: Secondary | ICD-10-CM | POA: Diagnosis not present

## 2018-12-23 ENCOUNTER — Other Ambulatory Visit: Payer: Self-pay

## 2018-12-23 ENCOUNTER — Ambulatory Visit (INDEPENDENT_AMBULATORY_CARE_PROVIDER_SITE_OTHER): Payer: Self-pay | Admitting: *Deleted

## 2018-12-23 DIAGNOSIS — Z86010 Personal history of colonic polyps: Secondary | ICD-10-CM

## 2018-12-23 NOTE — Progress Notes (Signed)
Pt would like to wait until Mar/Apr due to having a future knee replacement in Jan.  Pt aware that I will contact him once the schedules are available.

## 2018-12-23 NOTE — Progress Notes (Signed)
Let's triage him again closer to planned colonoscopy date.

## 2018-12-23 NOTE — Progress Notes (Addendum)
Gastroenterology Pre-Procedure Review  Request Date: 12/23/2018 Requesting Physician: Dr. Wende Neighbors, Last TCS 04/07/08 done by Dr. Gala Romney, no polyps, pt says he had hx polyps [facility]  PATIENT REVIEW QUESTIONS: The patient responded to the following health history questions as indicated:    1. Diabetes Melitis: no 2. 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 Major health problems in the past 3 months: no 4. Has an artificial valve or MVP: no 5. Has a defibrillator: no 6. Has been advised in past to take antibiotics in advance of a procedure like teeth cleaning: no 7. Family history of colon cancer: no 8. Alcohol Use: no 9. Illicit drug Use: no 10. History of sleep apnea:  no 11. History of coronary artery or other vascular stents placed within the last 12 months: no 12. History of any prior anesthesia complications: yes, nausea 13. There is no height or weight on file to calculate BMI. ht: 6'0 wt: 220 lbs    MEDICATIONS & ALLERGIES:    Patient reports the following regarding taking any blood thinners:   Plavix? no Aspirin? no Coumadin? no Brilinta? no Xarelto? no Eliquis? no Pradaxa? no Savaysa? no Effient? no  Patient confirms/reports the following medications:  Current Outpatient Medications  Medication Sig Dispense Refill  . acetaminophen (TYLENOL) 500 MG tablet Take 500 mg by mouth every 6 (six) hours as needed.    . fluticasone (FLONASE) 50 MCG/ACT nasal spray 1 spray by Each Nare route daily.    Marland Kitchen HYDROcodone-acetaminophen (NORCO/VICODIN) 5-325 MG tablet Take 1-2 tablets by mouth as needed for moderate pain or severe pain.   0  . loratadine (CLARITIN) 10 MG tablet Take 10 mg by mouth at bedtime.    Marland Kitchen losartan (COZAAR) 50 MG tablet     . meloxicam (MOBIC) 15 MG tablet Take 1 tablet (15 mg total) by mouth daily. 30 tablet 3  . Nutritional Supplements (QUINOA/KALE/HEMP) LIQD Take by mouth 2 (two) times daily.    Marland Kitchen omeprazole  (PRILOSEC) 20 MG capsule Take 20 mg by mouth daily.    . rosuvastatin (CRESTOR) 10 MG tablet Take 10 mg by mouth daily.     No current facility-administered medications for this visit.     Patient confirms/reports the following allergies:  Allergies  Allergen Reactions  . Penicillins Hives and Swelling  . Sulfa Antibiotics Hives and Swelling  . Adhesive [Tape] Hives  . Cefdinir Diarrhea    Other reaction(s): Other (See Comments) Dehydration  . Gabapentin     Other reaction(s): Dizziness  . Pregabalin Diarrhea  . Sulfacetamide Sodium-Sulfur     No orders of the defined types were placed in this encounter.   AUTHORIZATION INFORMATION Primary Insurance: Medicare,  ID #: [id number] Pre-Cert / Auth required: No, not required  Secondary Insurance: [place],  ID #:[id number],  Group #: [id number] [id number] Pre-Cert / Auth required: No, not required  SCHEDULE INFORMATION: Procedure has been scheduled as follows:  Date: 08/06/2019 , Time: 12:00 Location: [facility] with Dr. Gala Romney  This Gastroenterology Pre-Precedure Review Form is being routed to the following provider(s): Neil Crouch, PA-C

## 2018-12-24 NOTE — Progress Notes (Signed)
Spoke with pt and he requested for me to triage him again on 04/08/2019.

## 2018-12-27 DIAGNOSIS — H2513 Age-related nuclear cataract, bilateral: Secondary | ICD-10-CM | POA: Diagnosis not present

## 2018-12-27 DIAGNOSIS — H524 Presbyopia: Secondary | ICD-10-CM | POA: Diagnosis not present

## 2018-12-27 DIAGNOSIS — H43811 Vitreous degeneration, right eye: Secondary | ICD-10-CM | POA: Diagnosis not present

## 2018-12-27 DIAGNOSIS — H43392 Other vitreous opacities, left eye: Secondary | ICD-10-CM | POA: Diagnosis not present

## 2018-12-30 DIAGNOSIS — M1711 Unilateral primary osteoarthritis, right knee: Secondary | ICD-10-CM | POA: Diagnosis not present

## 2019-01-20 DIAGNOSIS — I10 Essential (primary) hypertension: Secondary | ICD-10-CM | POA: Diagnosis not present

## 2019-01-20 DIAGNOSIS — R7301 Impaired fasting glucose: Secondary | ICD-10-CM | POA: Diagnosis not present

## 2019-01-20 DIAGNOSIS — K219 Gastro-esophageal reflux disease without esophagitis: Secondary | ICD-10-CM | POA: Diagnosis not present

## 2019-01-20 DIAGNOSIS — E782 Mixed hyperlipidemia: Secondary | ICD-10-CM | POA: Diagnosis not present

## 2019-02-24 ENCOUNTER — Ambulatory Visit: Payer: Self-pay | Admitting: Physician Assistant

## 2019-02-24 NOTE — H&P (Signed)
TOTAL KNEE ADMISSION H&P  Patient is being admitted for right total knee arthroplasty.  Subjective:  Chief Complaint:right knee pain.  HPI: Lawrence White, 69 y.o. male, has a history of pain and functional disability in the right knee due to arthritis and has failed non-surgical conservative treatments for greater than 12 weeks to includeNSAID's and/or analgesics, corticosteriod injections, viscosupplementation injections, use of assistive devices and activity modification.  Onset of symptoms was gradual, starting 8 years ago with gradually worsening course since that time. The patient noted prior procedures on the knee to include  arthroscopy and menisectomy on the right knee(s).  Patient currently rates pain in the right knee(s) at 8 out of 10 with activity. Patient has night pain, worsening of pain with activity and weight bearing, pain that interferes with activities of daily living, pain with passive range of motion, crepitus and joint swelling.  Patient has evidence of periarticular osteophytes and joint space narrowing by imaging studies.There is no active infection.  Patient Active Problem List   Diagnosis Date Noted  . Cervical spondylosis 05/07/2018  . Spondylolisthesis of lumbosacral region 05/07/2018   Past Medical History:  Diagnosis Date  . Gastroesophageal reflux   . Hyperlipidemia   . Hypertension   . Sinus infection     Past Surgical History:  Procedure Laterality Date  . BACK SURGERY      Current Outpatient Medications  Medication Sig Dispense Refill Last Dose  . acetaminophen (TYLENOL) 500 MG tablet Take 500-1,000 mg by mouth every 6 (six) hours as needed (pain. (MAX 4-5 TABLETS/24HRS.)).      Marland Kitchen atorvastatin (LIPITOR) 40 MG tablet Take 40 mg by mouth daily.     . Cholecalciferol (VITAMIN D3) 50 MCG (2000 UT) TABS Take 2,000 Units by mouth every evening.     . fluticasone (FLONASE) 50 MCG/ACT nasal spray Place 1 spray into both nostrils daily.      Marland Kitchen  HYDROcodone-acetaminophen (NORCO/VICODIN) 5-325 MG tablet Take 1-2 tablets by mouth 2 (two) times daily as needed (severe back pain.).   0   . loratadine (CLARITIN) 10 MG tablet Take 10 mg by mouth daily.      Marland Kitchen losartan (COZAAR) 50 MG tablet Take 50 mg by mouth at bedtime.      . meloxicam (MOBIC) 15 MG tablet Take 1 tablet (15 mg total) by mouth daily. (Patient taking differently: Take 15 mg by mouth at bedtime. ) 30 tablet 3   . NON FORMULARY Take 10 drops by mouth at bedtime.     Marland Kitchen omeprazole (PRILOSEC) 20 MG capsule Take 20 mg by mouth every Monday, Tuesday, Wednesday, Thursday, and Friday. Before supper     . OVER THE COUNTER MEDICATION Take 4 capsules by mouth 2 (two) times daily. Relief Factor     . rosuvastatin (CRESTOR) 10 MG tablet Take 10 mg by mouth at bedtime.       No current facility-administered medications for this visit.   Allergies  Allergen Reactions  . Penicillins Hives and Swelling    Did it involve swelling of the face/tongue/throat, SOB, or low BP? Unknown Did it involve sudden or severe rash/hives, skin peeling, or any reaction on the inside of your mouth or nose? Unknown Did you need to seek medical attention at a hospital or doctor's office? Unknown When did it last happen?childhood reaction If all above answers are "NO", may proceed with cephalosporin use.   . Sulfa Antibiotics Hives and Swelling  . Adhesive [Tape] Hives  . Cefdinir Diarrhea  Other reaction(s): Other (See Comments) Dehydration  . Gabapentin Other (See Comments)    Dizziness  . Pregabalin Diarrhea  . Sulfacetamide Sodium-Sulfur     Social History   Tobacco Use  . Smoking status: Former Smoker    Packs/day: 1.50    Years: 19.00    Pack years: 28.50    Types: Cigarettes    Quit date: 01/28/1974    Years since quitting: 45.1  . Smokeless tobacco: Never Used  Substance Use Topics  . Alcohol use: No    No family history on file.   Review of Systems  HENT: Positive for  hearing loss and tinnitus.   Gastrointestinal: Positive for nausea.  Musculoskeletal: Positive for joint swelling and myalgias.  All other systems reviewed and are negative.   Objective:  Physical Exam  Constitutional: He is oriented to person, place, and time. He appears well-developed and well-nourished. No distress.  HENT:  Head: Normocephalic and atraumatic.  Eyes: Pupils are equal, round, and reactive to light. Conjunctivae and EOM are normal.  Cardiovascular: Normal rate, regular rhythm, normal heart sounds and intact distal pulses.  Respiratory: Effort normal and breath sounds normal. No respiratory distress. He has no wheezes.  GI: Soft. Bowel sounds are normal. He exhibits no distension. There is no abdominal tenderness.  Musculoskeletal:     Cervical back: Normal range of motion and neck supple.     Right knee: Swelling and bony tenderness present. Decreased range of motion. Tenderness present.  Lymphadenopathy:    He has no cervical adenopathy.  Neurological: He is alert and oriented to person, place, and time.  Skin: Skin is warm and dry. No rash noted. No erythema.  Psychiatric: He has a normal mood and affect. His behavior is normal.    Vital signs in last 24 hours: @VSRANGES @  Labs:   Estimated body mass index is 26.99 kg/m as calculated from the following:   Height as of 04/01/15: 6' (1.829 m).   Weight as of 04/01/15: 90.3 kg.   Imaging Review Plain radiographs demonstrate moderate degenerative joint disease of the right knee(s). The overall alignment ismild varus. The bone quality appears to be good for age and reported activity level.      Assessment/Plan:  End stage arthritis, right knee   The patient history, physical examination, clinical judgment of the provider and imaging studies are consistent with end stage degenerative joint disease of the right knee(s) and total knee arthroplasty is deemed medically necessary. The treatment options including  medical management, injection therapy arthroscopy and arthroplasty were discussed at length. The risks and benefits of total knee arthroplasty were presented and reviewed. The risks due to aseptic loosening, infection, stiffness, patella tracking problems, thromboembolic complications and other imponderables were discussed. The patient acknowledged the explanation, agreed to proceed with the plan and consent was signed. Patient is being admitted for inpatient treatment for surgery, pain control, PT, OT, prophylactic antibiotics, VTE prophylaxis, progressive ambulation and ADL's and discharge planning. The patient is planning to be discharged home with home health services    Anticipated LOS equal to or greater than 2 midnights due to - Age 24 and older with one or more of the following:  - Obesity  - Expected need for hospital services (PT, OT, Nursing) required for safe  discharge  - Anticipated need for postoperative skilled nursing care or inpatient rehab  - Active co-morbidities: None OR   - Unanticipated findings during/Post Surgery: None  - Patient is a high risk of re-admission  due to: None  

## 2019-02-24 NOTE — H&P (View-Only) (Signed)
TOTAL KNEE ADMISSION H&P  Patient is being admitted for right total knee arthroplasty.  Subjective:  Chief Complaint:right knee pain.  HPI: Lawrence White, 69 y.o. male, has a history of pain and functional disability in the right knee due to arthritis and has failed non-surgical conservative treatments for greater than 12 weeks to includeNSAID's and/or analgesics, corticosteriod injections, viscosupplementation injections, use of assistive devices and activity modification.  Onset of symptoms was gradual, starting 8 years ago with gradually worsening course since that time. The patient noted prior procedures on the knee to include  arthroscopy and menisectomy on the right knee(s).  Patient currently rates pain in the right knee(s) at 8 out of 10 with activity. Patient has night pain, worsening of pain with activity and weight bearing, pain that interferes with activities of daily living, pain with passive range of motion, crepitus and joint swelling.  Patient has evidence of periarticular osteophytes and joint space narrowing by imaging studies.There is no active infection.  Patient Active Problem List   Diagnosis Date Noted  . Cervical spondylosis 05/07/2018  . Spondylolisthesis of lumbosacral region 05/07/2018   Past Medical History:  Diagnosis Date  . Gastroesophageal reflux   . Hyperlipidemia   . Hypertension   . Sinus infection     Past Surgical History:  Procedure Laterality Date  . BACK SURGERY      Current Outpatient Medications  Medication Sig Dispense Refill Last Dose  . acetaminophen (TYLENOL) 500 MG tablet Take 500-1,000 mg by mouth every 6 (six) hours as needed (pain. (MAX 4-5 TABLETS/24HRS.)).      Marland Kitchen atorvastatin (LIPITOR) 40 MG tablet Take 40 mg by mouth daily.     . Cholecalciferol (VITAMIN D3) 50 MCG (2000 UT) TABS Take 2,000 Units by mouth every evening.     . fluticasone (FLONASE) 50 MCG/ACT nasal spray Place 1 spray into both nostrils daily.      Marland Kitchen  HYDROcodone-acetaminophen (NORCO/VICODIN) 5-325 MG tablet Take 1-2 tablets by mouth 2 (two) times daily as needed (severe back pain.).   0   . loratadine (CLARITIN) 10 MG tablet Take 10 mg by mouth daily.      Marland Kitchen losartan (COZAAR) 50 MG tablet Take 50 mg by mouth at bedtime.      . meloxicam (MOBIC) 15 MG tablet Take 1 tablet (15 mg total) by mouth daily. (Patient taking differently: Take 15 mg by mouth at bedtime. ) 30 tablet 3   . NON FORMULARY Take 10 drops by mouth at bedtime.     Marland Kitchen omeprazole (PRILOSEC) 20 MG capsule Take 20 mg by mouth every Monday, Tuesday, Wednesday, Thursday, and Friday. Before supper     . OVER THE COUNTER MEDICATION Take 4 capsules by mouth 2 (two) times daily. Relief Factor     . rosuvastatin (CRESTOR) 10 MG tablet Take 10 mg by mouth at bedtime.       No current facility-administered medications for this visit.   Allergies  Allergen Reactions  . Penicillins Hives and Swelling    Did it involve swelling of the face/tongue/throat, SOB, or low BP? Unknown Did it involve sudden or severe rash/hives, skin peeling, or any reaction on the inside of your mouth or nose? Unknown Did you need to seek medical attention at a hospital or doctor's office? Unknown When did it last happen?childhood reaction If all above answers are "NO", may proceed with cephalosporin use.   . Sulfa Antibiotics Hives and Swelling  . Adhesive [Tape] Hives  . Cefdinir Diarrhea  Other reaction(s): Other (See Comments) Dehydration  . Gabapentin Other (See Comments)    Dizziness  . Pregabalin Diarrhea  . Sulfacetamide Sodium-Sulfur     Social History   Tobacco Use  . Smoking status: Former Smoker    Packs/day: 1.50    Years: 19.00    Pack years: 28.50    Types: Cigarettes    Quit date: 01/28/1974    Years since quitting: 45.1  . Smokeless tobacco: Never Used  Substance Use Topics  . Alcohol use: No    No family history on file.   Review of Systems  HENT: Positive for  hearing loss and tinnitus.   Gastrointestinal: Positive for nausea.  Musculoskeletal: Positive for joint swelling and myalgias.  All other systems reviewed and are negative.   Objective:  Physical Exam  Constitutional: He is oriented to person, place, and time. He appears well-developed and well-nourished. No distress.  HENT:  Head: Normocephalic and atraumatic.  Eyes: Pupils are equal, round, and reactive to light. Conjunctivae and EOM are normal.  Cardiovascular: Normal rate, regular rhythm, normal heart sounds and intact distal pulses.  Respiratory: Effort normal and breath sounds normal. No respiratory distress. He has no wheezes.  GI: Soft. Bowel sounds are normal. He exhibits no distension. There is no abdominal tenderness.  Musculoskeletal:     Cervical back: Normal range of motion and neck supple.     Right knee: Swelling and bony tenderness present. Decreased range of motion. Tenderness present.  Lymphadenopathy:    He has no cervical adenopathy.  Neurological: He is alert and oriented to person, place, and time.  Skin: Skin is warm and dry. No rash noted. No erythema.  Psychiatric: He has a normal mood and affect. His behavior is normal.    Vital signs in last 24 hours: @VSRANGES @  Labs:   Estimated body mass index is 26.99 kg/m as calculated from the following:   Height as of 04/01/15: 6' (1.829 m).   Weight as of 04/01/15: 90.3 kg.   Imaging Review Plain radiographs demonstrate moderate degenerative joint disease of the right knee(s). The overall alignment ismild varus. The bone quality appears to be good for age and reported activity level.      Assessment/Plan:  End stage arthritis, right knee   The patient history, physical examination, clinical judgment of the provider and imaging studies are consistent with end stage degenerative joint disease of the right knee(s) and total knee arthroplasty is deemed medically necessary. The treatment options including  medical management, injection therapy arthroscopy and arthroplasty were discussed at length. The risks and benefits of total knee arthroplasty were presented and reviewed. The risks due to aseptic loosening, infection, stiffness, patella tracking problems, thromboembolic complications and other imponderables were discussed. The patient acknowledged the explanation, agreed to proceed with the plan and consent was signed. Patient is being admitted for inpatient treatment for surgery, pain control, PT, OT, prophylactic antibiotics, VTE prophylaxis, progressive ambulation and ADL's and discharge planning. The patient is planning to be discharged home with home health services    Anticipated LOS equal to or greater than 2 midnights due to - Age 24 and older with one or more of the following:  - Obesity  - Expected need for hospital services (PT, OT, Nursing) required for safe  discharge  - Anticipated need for postoperative skilled nursing care or inpatient rehab  - Active co-morbidities: None OR   - Unanticipated findings during/Post Surgery: None  - Patient is a high risk of re-admission  due to: None

## 2019-02-27 ENCOUNTER — Other Ambulatory Visit (HOSPITAL_COMMUNITY): Payer: Self-pay | Admitting: *Deleted

## 2019-02-27 NOTE — Patient Instructions (Addendum)
DUE TO COVID-19 ONLY ONE VISITOR IS ALLOWED TO COME WITH YOU AND STAY IN THE WAITING ROOM ONLY DURING PRE OP AND PROCEDURE DAY OF SURGERY. THE 1 VISITOR MAY VISIT WITH YOU AFTER SURGERY IN YOUR PRIVATE ROOM DURING VISITING HOURS ONLY!  YOU NEED TO HAVE A COVID 19 TEST ON__Tuesday 01/12/2021_____ @_3  pm______, THIS TEST MUST BE DONE BEFORE SURGERY, [facility]  [place], [place] ONCE YOUR COVID TEST IS COMPLETED, PLEASE BEGIN THE QUARANTINE INSTRUCTIONS AS OUTLINED IN YOUR HANDOUT.                Lawrence White     Your procedure is scheduled on: Friday 03/07/2019   Report to [facility] Main  Entrance    Report to Short Stay at  [facility] AM     Call this number if you have problems the morning of surgery [phone]    Remember: Do not eat food  :After Midnight.    NO SOLID FOOD AFTER MIDNIGHT THE NIGHT PRIOR TO SURGERY. NOTHING BY MOUTH EXCEPT CLEAR LIQUIDS UNTIL  0430 am  .     PLEASE FINISH ENSURE DRINK PER SURGEON ORDER  WHICH NEEDS TO BE COMPLETED AT 0430 am .   CLEAR LIQUID DIET   Foods Allowed                                                                     Foods Excluded  Coffee and tea, regular and decaf                             liquids that you cannot  Plain Jell-O any favor except red or purple                                           see through such as: Fruit ices (not with fruit pulp)                                     milk, soups, orange juice  Iced Popsicles                                    All solid food Carbonated beverages, regular and diet                                    Cranberry, grape and apple juices Sports drinks like Gatorade Lightly seasoned clear broth or consume(fat free) Sugar, honey syrup  Sample Menu Breakfast                                Lunch  Supper Cranberry juice                    Beef broth                            Chicken broth Jell-O                                      Grape juice                           Apple juice Coffee or tea                        Jell-O                                      Popsicle                                                Coffee or tea                        Coffee or tea  _____________________________________________________________________     BRUSH YOUR TEETH MORNING OF SURGERY AND RINSE YOUR MOUTH OUT, NO CHEWING GUM CANDY OR MINTS.     Take these medicines the morning of surgery with A SIP OF WATER:   Use Flonase nasal spray                                 You may not have any metal on your body including hair pins and              piercings  Do not wear jewelry, make-up, lotions, powders or perfumes, deodorant                          Men may shave face and neck.   Do not bring valuables to the hospital. [facility] IS NOT             RESPONSIBLE   FOR VALUABLES.  Contacts, dentures or bridgework may not be worn into surgery.  Leave suitcase in the car. After surgery it may be brought to your room.     Patients discharged the day of surgery will not be allowed to drive home. IF YOU ARE HAVING SURGERY AND GOING HOME THE SAME DAY, YOU MUST HAVE AN ADULT TO DRIVE YOU HOME AND  BE WITH YOU FOR 24 HOURS. YOU MAY GO HOME BY TAXI OR UBER OR ORTHERWISE, BUT AN ADULT MUST ACCOMPANY YOU HOME AND STAY WITH YOU FOR 24 HOURS.  Name and phone number of your driver:spouse- Britta Mccreedy  [phone]                Please read over the following fact sheets you were given: _____________________________________________________________________             [facility] - Preparing for Surgery Before surgery, you can play an important role.  Because skin is  not sterile, your skin needs to be as free of germs as possible.  You can reduce the number of germs on your skin by washing with CHG (chlorahexidine gluconate) soap before surgery.  CHG is an antiseptic cleaner which kills germs and bonds with the skin to continue killing  germs even after washing. Please DO NOT use if you have an allergy to CHG or antibacterial soaps.  If your skin becomes reddened/irritated stop using the CHG and inform your nurse when you arrive at Short Stay. Do not shave (including legs and underarms) for at least 48 hours prior to the first CHG shower.  You may shave your face/neck. Please follow these instructions carefully:  1.  Shower with CHG Soap the night before surgery and the  morning of Surgery.  2.  If you choose to wash your hair, wash your hair first as usual with your  normal  shampoo.  3.  After you shampoo, rinse your hair and body thoroughly to remove the  shampoo.                           4.  Use CHG as you would any other liquid soap.  You can apply chg directly  to the skin and wash                       Gently with a scrungie or clean washcloth.  5.  Apply the CHG Soap to your body ONLY FROM THE NECK DOWN.   Do not use on face/ open                           Wound or open sores. Avoid contact with eyes, ears mouth and genitals (private parts).                       Wash face,  Genitals (private parts) with your normal soap.             6.  Wash thoroughly, paying special attention to the area where your surgery  will be performed.  7.  Thoroughly rinse your body with warm water from the neck down.  8.  DO NOT shower/wash with your normal soap after using and rinsing off  the CHG Soap.                9.  Pat yourself dry with a clean towel.            10.  Wear clean pajamas.            11.  Place clean sheets on your bed the night of your first shower and do not  sleep with pets. Day of Surgery : Do not apply any lotions/deodorants the morning of surgery.  Please wear clean clothes to the hospital/surgery center.  FAILURE TO FOLLOW THESE INSTRUCTIONS MAY RESULT IN THE CANCELLATION OF YOUR SURGERY PATIENT SIGNATURE_________________________________  NURSE  SIGNATURE__________________________________  ________________________________________________________________________   Lawrence White  An incentive spirometer is a tool that can help keep your lungs clear and active. This tool measures how well you are filling your lungs with each breath. Taking long deep breaths may help reverse or decrease the chance of developing breathing (pulmonary) problems (especially infection) following:  A long period of time when you are unable to move or be active. BEFORE THE PROCEDURE   If the spirometer  includes an indicator to show your best effort, your nurse or respiratory therapist will set it to a desired goal.  If possible, sit up straight or lean slightly forward. Try not to slouch.  Hold the incentive spirometer in an upright position. INSTRUCTIONS FOR USE  1. Sit on the edge of your bed if possible, or sit up as far as you can in bed or on a chair. 2. Hold the incentive spirometer in an upright position. 3. Breathe out normally. 4. Place the mouthpiece in your mouth and seal your lips tightly around it. 5. Breathe in slowly and as deeply as possible, raising the piston or the ball toward the top of the column. 6. Hold your breath for 3-5 seconds or for as long as possible. Allow the piston or ball to fall to the bottom of the column. 7. Remove the mouthpiece from your mouth and breathe out normally. 8. Rest for a few seconds and repeat Steps 1 through 7 at least 10 times every 1-2 hours when you are awake. Take your time and take a few normal breaths between deep breaths. 9. The spirometer may include an indicator to show your best effort. Use the indicator as a goal to work toward during each repetition. 10. After each set of 10 deep breaths, practice coughing to be sure your lungs are clear. If you have an incision (the cut made at the time of surgery), support your incision when coughing by placing a pillow or rolled up towels firmly  against it. Once you are able to get out of bed, walk around indoors and cough well. You may stop using the incentive spirometer when instructed by your caregiver.  RISKS AND COMPLICATIONS  Take your time so you do not get dizzy or light-headed.  If you are in pain, you may need to take or ask for pain medication before doing incentive spirometry. It is harder to take a deep breath if you are having pain. AFTER USE  Rest and breathe slowly and easily.  It can be helpful to keep track of a log of your progress. Your caregiver can provide you with a simple table to help with this. If you are using the spirometer at home, follow these instructions: [place] IF:   You are having difficultly using the spirometer.  You have trouble using the spirometer as often as instructed.  Your pain medication is not giving enough relief while using the spirometer.  You develop fever of 100.5 F (38.1 C) or higher. SEEK IMMEDIATE MEDICAL CARE IF:   You cough up bloody sputum that had not been present before.  You develop fever of 102 F (38.9 C) or greater.  You develop worsening pain at or near the incision site. MAKE SURE YOU:   Understand these instructions.  Will watch your condition.  Will get help right away if you are not doing well or get worse. Document Released: 06/19/2006 Document Revised: 05/01/2011 Document Reviewed: 08/20/2006 Exit[employer] Patient Information 2014 Exit[employer], [place].   ________________________________________________________________________  WHAT IS A BLOOD TRANSFUSION? Blood Transfusion Information  A transfusion is the replacement of blood or some of its parts. Blood is made up of multiple cells which provide different functions.  Red blood cells carry oxygen and are used for blood loss replacement.  White blood cells fight against infection.  Platelets control bleeding.  Plasma helps clot blood.  Other blood products are available for  specialized needs, such as hemophilia or other clotting disorders. BEFORE THE  TRANSFUSION  Who gives blood for transfusions?   Healthy volunteers who are fully evaluated to make sure their blood is safe. This is blood bank blood. Transfusion therapy is the safest it has ever been in the practice of medicine. Before blood is taken from a donor, a complete history is taken to make sure that person has no history of diseases nor engages in risky social behavior (examples are intravenous drug use or sexual activity with multiple partners). The donor's travel history is screened to minimize risk of transmitting infections, such as malaria. The donated blood is tested for signs of infectious diseases, such as HIV and hepatitis. The blood is then tested to be sure it is compatible with you in order to minimize the chance of a transfusion reaction. If you or a relative donates blood, this is often done in anticipation of surgery and is not appropriate for emergency situations. It takes many days to process the donated blood. RISKS AND COMPLICATIONS Although transfusion therapy is very safe and saves many lives, the main dangers of transfusion include:   Getting an infectious disease.  Developing a transfusion reaction. This is an allergic reaction to something in the blood you were given. Every precaution is taken to prevent this. The decision to have a blood transfusion has been considered carefully by your caregiver before blood is given. Blood is not given unless the benefits outweigh the risks. AFTER THE TRANSFUSION  Right after receiving a blood transfusion, you will usually feel much better and more energetic. This is especially true if your red blood cells have gotten low (anemic). The transfusion raises the level of the red blood cells which carry oxygen, and this usually causes an energy increase.  The nurse administering the transfusion will monitor you carefully for complications. HOME CARE  INSTRUCTIONS  No special instructions are needed after a transfusion. You may find your energy is better. Speak with your caregiver about any limitations on activity for underlying diseases you may have. SEEK MEDICAL CARE IF:   Your condition is not improving after your transfusion.  You develop redness or irritation at the intravenous (IV) site. SEEK IMMEDIATE MEDICAL CARE IF:  Any of the following symptoms occur over the next 12 hours:  Shaking chills.  You have a temperature by mouth above 102 F (38.9 C), not controlled by medicine.  Chest, back, or muscle pain.  People around you feel you are not acting correctly or are confused.  Shortness of breath or difficulty breathing.  Dizziness and fainting.  You get a rash or develop hives.  You have a decrease in urine output.  Your urine turns a dark color or changes to pink, red, or brown. Any of the following symptoms occur over the next 10 days:  You have a temperature by mouth above 102 F (38.9 C), not controlled by medicine.  Shortness of breath.  Weakness after normal activity.  The white part of the eye turns yellow (jaundice).  You have a decrease in the amount of urine or are urinating less often.  Your urine turns a dark color or changes to pink, red, or brown. Document Released: 02/04/2000 Document Revised: 05/01/2011 Document Reviewed: 09/23/2007 [facility] Patient Information 2014 [place], [place].  _______________________________________________________________________

## 2019-02-28 ENCOUNTER — Other Ambulatory Visit: Payer: Self-pay

## 2019-02-28 ENCOUNTER — Encounter (HOSPITAL_COMMUNITY)
Admission: RE | Admit: 2019-02-28 | Discharge: 2019-02-28 | Disposition: A | Discharge status: Home / Self Care | Payer: Medicare Other | Source: Ambulatory Visit | Attending: Orthopedic Surgery | Admitting: Orthopedic Surgery

## 2019-02-28 ENCOUNTER — Encounter (HOSPITAL_COMMUNITY): Payer: Self-pay

## 2019-02-28 DIAGNOSIS — Z01818 Encounter for other preprocedural examination: Secondary | ICD-10-CM | POA: Diagnosis not present

## 2019-02-28 DIAGNOSIS — I10 Essential (primary) hypertension: Secondary | ICD-10-CM | POA: Insufficient documentation

## 2019-02-28 HISTORY — DX: Other seasonal allergic rhinitis: J30.2

## 2019-02-28 HISTORY — DX: Other complications of anesthesia, initial encounter: T88.59XA

## 2019-02-28 HISTORY — DX: Unspecified osteoarthritis, unspecified site: M19.90

## 2019-02-28 HISTORY — DX: Foot drop, left foot: M21.372

## 2019-02-28 HISTORY — DX: Other specified postprocedural states: R11.2

## 2019-02-28 HISTORY — DX: Other specified postprocedural states: Z98.890

## 2019-02-28 NOTE — Progress Notes (Signed)
  Patient's date of birth is-10-25-1950  Your patient has screened as at an elevated risk for Obstructive Sleep Apnea using the STOP-BANG tool during a pre-surgical visit. A score of 4 or greater is an elevated risk.

## 2019-02-28 NOTE — Progress Notes (Signed)
PCP - Dr. Alice Reichert, [place] Cardiologist - n/a  Chest x-ray - n/a EKG - n/a Stress Test - many years ago-negative result ECHO - n/a Cardiac Cath - n/a  Sleep Study - n/a CPAP - n/a  Fasting Blood Sugar -n/a  Checks Blood Sugar ___0__ times a day  Blood Thinner Instructions:n/a Aspirin Instructions:n/a Last Dose:Patient states he takes Mobic and he was instructed that the last dose was 02/27/2019.  Anesthesia review:  Chart given to Jodell Cipro, PA to review medical history and talk to patient about his previous back surgeries and not wanting a spinal due to scar tissue and neuropathy.  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  Patient denies shortness of breath, fever, cough and chest pain at PAT appointment   Patient verbalized understanding of instructions that were given to them at the PAT appointment. Patient was also instructed that they will need to review over the PAT instructions again at home before surgery.

## 2019-03-03 ENCOUNTER — Encounter (HOSPITAL_COMMUNITY)
Admission: RE | Admit: 2019-03-03 | Discharge: 2019-03-03 | Disposition: A | Discharge status: Home / Self Care | Payer: Medicare Other | Source: Ambulatory Visit | Attending: Orthopedic Surgery | Admitting: Orthopedic Surgery

## 2019-03-03 ENCOUNTER — Other Ambulatory Visit: Payer: Self-pay

## 2019-03-03 ENCOUNTER — Encounter (INDEPENDENT_AMBULATORY_CARE_PROVIDER_SITE_OTHER): Payer: Self-pay

## 2019-03-03 DIAGNOSIS — Z01818 Encounter for other preprocedural examination: Secondary | ICD-10-CM | POA: Diagnosis not present

## 2019-03-03 DIAGNOSIS — I10 Essential (primary) hypertension: Secondary | ICD-10-CM | POA: Diagnosis not present

## 2019-03-03 LAB — COMPREHENSIVE METABOLIC PANEL
ALT: 33 U/L (ref 0–44)
AST: 38 U/L (ref 15–41)
Albumin: 4.6 g/dL (ref 3.5–5.0)
Alkaline Phosphatase: 53 U/L (ref 38–126)
Anion gap: 8 (ref 5–15)
BUN: 17 mg/dL (ref 8–23)
CO2: 27 mmol/L (ref 22–32)
Calcium: 9.7 mg/dL (ref 8.9–10.3)
Chloride: 105 mmol/L (ref 98–111)
Creatinine, Ser: 0.87 mg/dL (ref 0.61–1.24)
GFR calc Af Amer: >60 mL/min (ref >60)
GFR calc non Af Amer: >60 mL/min (ref >60)
Glucose, Bld: 119 mg/dL — ABNORMAL HIGH (ref 70–99)
Potassium: 4.5 mmol/L (ref 3.5–5.1)
Sodium: 140 mmol/L (ref 135–145)
Total Bilirubin: 1.3 mg/dL — ABNORMAL HIGH (ref 0.3–1.2)
Total Protein: 7.2 g/dL (ref 6.5–8.1)

## 2019-03-03 LAB — CBC WITH DIFFERENTIAL/PLATELET
Abs Immature Granulocytes: 0.02 10*3/uL (ref 0.00–0.07)
Basophils Absolute: 0.1 10*3/uL (ref 0.0–0.1)
Basophils Relative: 1 %
Eosinophils Absolute: 0.3 10*3/uL (ref 0.0–0.5)
Eosinophils Relative: 4 %
HCT: 49.5 % (ref 39.0–52.0)
Hemoglobin: 16.2 g/dL (ref 13.0–17.0)
Immature Granulocytes: 0 %
Lymphocytes Relative: 37 %
Lymphs Abs: 2.9 10*3/uL (ref 0.7–4.0)
MCH: 29 pg (ref 26.0–34.0)
MCHC: 32.7 g/dL (ref 30.0–36.0)
MCV: 88.6 fL (ref 80.0–100.0)
Monocytes Absolute: 0.5 10*3/uL (ref 0.1–1.0)
Monocytes Relative: 6 %
Neutro Abs: 4.1 10*3/uL (ref 1.7–7.7)
Neutrophils Relative %: 52 %
Platelets: 224 10*3/uL (ref 150–400)
RBC: 5.59 MIL/uL (ref 4.22–5.81)
RDW: 12.4 % (ref 11.5–15.5)
WBC: 7.8 10*3/uL (ref 4.0–10.5)
nRBC: 0 % (ref 0.0–0.2)

## 2019-03-03 LAB — PROTIME-INR
INR: 0.9 (ref 0.8–1.2)
Prothrombin Time: 12.3 seconds (ref 11.4–15.2)

## 2019-03-03 LAB — APTT: aPTT: 27 seconds (ref 24–36)

## 2019-03-03 LAB — ABO/RH: ABO/RH(D): O POS

## 2019-03-03 LAB — SURGICAL PCR SCREEN
MRSA, PCR: NEGATIVE
Staphylococcus aureus: NEGATIVE

## 2019-03-03 NOTE — Care Plan (Signed)
Ortho Bundle Case Management Note  Patient Details  Name: Lawrence White MRN: [id number] Date of Birth: 1950-10-21  Spoke with patient prior to surgery. He plans to discharge to home with family and HHPT. [facility]. [facility]PP[facility] up [facility]. Rolling walker and CPM ordered from [employer] to be delivered to patient's home prior to surgery. Patient and MD in agreement with plan. Choice offered.                   DME Arranged:  Dan Humphreys rolling, CPM DME Agency:  [employer]  HH Arranged:  PT HH Agency:  [facility]  Additional Comments: Please contact me with any questions of if this plan should need to change.  Shauna Hugh,  RN,BSN,MHA,CCM  [facility] Orthopaedic Specialist  [phone] 03/03/2019, 10:26 AM

## 2019-03-04 ENCOUNTER — Other Ambulatory Visit: Payer: Medicare Other

## 2019-03-04 ENCOUNTER — Other Ambulatory Visit (HOSPITAL_COMMUNITY)
Admission: RE | Admit: 2019-03-04 | Discharge: 2019-03-04 | Disposition: A | Discharge status: Home / Self Care | Payer: Medicare Other | Source: Ambulatory Visit | Attending: Orthopedic Surgery | Admitting: Orthopedic Surgery

## 2019-03-04 DIAGNOSIS — Z01812 Encounter for preprocedural laboratory examination: Secondary | ICD-10-CM | POA: Insufficient documentation

## 2019-03-04 DIAGNOSIS — Z20822 Contact with and (suspected) exposure to covid-19: Secondary | ICD-10-CM | POA: Insufficient documentation

## 2019-03-04 LAB — SARS CORONAVIRUS 2 (TAT 6-24 HRS): SARS Coronavirus 2: NEGATIVE

## 2019-03-05 NOTE — Progress Notes (Signed)
Anesthesia Consult  H/o multiple lumbar surgeries with hardware in place L4-5, L5-S1, the level above the fusion L3-4 with mild to moderate central canal narrowing, ligamentum flavum thickening.  Pt expresses concerns regarding spinal due to previous lumbar surgeries.  He would like to proceed with a spinal if anesthesia feels appropriate.  Anesthesia to evaluate and discuss with pt DOS.   Janey Genta [facility] Pre-Surgical Testing [phone] 03/05/19  12:54 PM

## 2019-03-06 MED ORDER — TRANEXAMIC ACID 1000 MG/10ML IV SOLN
2000.0000 mg | INTRAVENOUS | Status: DC
  Filled 2019-03-06: qty 20

## 2019-03-06 MED ORDER — BUPIVACAINE LIPOSOME 1.3 % IJ SUSP
20.0000 mL | Freq: Once | INTRAMUSCULAR | Status: DC
  Filled 2019-03-06: qty 20

## 2019-03-06 NOTE — Anesthesia Preprocedure Evaluation (Addendum)
Anesthesia Evaluation  Patient identified by MRN, date of birth, ID band Patient awake    Reviewed: Allergy & Precautions, NPO status , Patient's Chart, lab work & pertinent test results  History of Anesthesia Complications (+) PONV and history of anesthetic complications  Airway Mallampati: I       Dental no notable dental hx. (+) Teeth Intact   Pulmonary former smoker,    Pulmonary exam normal breath sounds clear to auscultation       Cardiovascular hypertension, Pt. on medications Normal cardiovascular exam Rhythm:Regular Rate:Normal     Neuro/Psych negative psych ROS   GI/Hepatic Neg liver ROS, GERD  Medicated,  Endo/Other  negative endocrine ROS  Renal/GU negative Renal ROS  negative genitourinary   Musculoskeletal  (+) Arthritis , Osteoarthritis,    Abdominal Normal abdominal exam  (+)   Peds  Hematology negative hematology ROS (+)   Anesthesia Other Findings   Reproductive/Obstetrics                            Anesthesia Physical Anesthesia Plan  ASA: II  Anesthesia Plan: Spinal   Post-op Pain Management:  Regional for Post-op pain   Induction:   PONV Risk Score and Plan: 2 and Ondansetron, Dexamethasone and Midazolam  Airway Management Planned: Natural Airway, Nasal Cannula and Simple Face Mask  Additional Equipment: None  Intra-op Plan:   Post-operative Plan:   Informed Consent: I have reviewed the patients History and Physical, chart, labs and discussed the procedure including the risks, benefits and alternatives for the proposed anesthesia with the patient or authorized representative who has indicated his/her understanding and acceptance.       Plan Discussed with: CRNA  Anesthesia Plan Comments:       Anesthesia Quick Evaluation

## 2019-03-07 ENCOUNTER — Ambulatory Visit (HOSPITAL_COMMUNITY): Payer: Medicare Other | Admitting: Physician Assistant

## 2019-03-07 ENCOUNTER — Encounter (HOSPITAL_COMMUNITY)
Admission: RE | Disposition: A | Discharge status: Home / Self Care | Payer: Self-pay | Source: Ambulatory Visit | Attending: Orthopedic Surgery

## 2019-03-07 ENCOUNTER — Ambulatory Visit (HOSPITAL_COMMUNITY)
Admission: RE | Admit: 2019-03-07 | Discharge: 2019-03-07 | Disposition: A | Discharge status: Home / Self Care | Payer: Medicare Other | Source: Ambulatory Visit | Attending: Orthopedic Surgery | Admitting: Orthopedic Surgery

## 2019-03-07 ENCOUNTER — Encounter (HOSPITAL_COMMUNITY): Payer: Self-pay | Admitting: Orthopedic Surgery

## 2019-03-07 DIAGNOSIS — G8918 Other acute postprocedural pain: Secondary | ICD-10-CM | POA: Diagnosis not present

## 2019-03-07 DIAGNOSIS — Z791 Long term (current) use of non-steroidal anti-inflammatories (NSAID): Secondary | ICD-10-CM | POA: Diagnosis not present

## 2019-03-07 DIAGNOSIS — M47812 Spondylosis without myelopathy or radiculopathy, cervical region: Secondary | ICD-10-CM | POA: Insufficient documentation

## 2019-03-07 DIAGNOSIS — K219 Gastro-esophageal reflux disease without esophagitis: Secondary | ICD-10-CM | POA: Insufficient documentation

## 2019-03-07 DIAGNOSIS — M1711 Unilateral primary osteoarthritis, right knee: Secondary | ICD-10-CM | POA: Insufficient documentation

## 2019-03-07 DIAGNOSIS — Z79899 Other long term (current) drug therapy: Secondary | ICD-10-CM | POA: Diagnosis not present

## 2019-03-07 DIAGNOSIS — E669 Obesity, unspecified: Secondary | ICD-10-CM | POA: Insufficient documentation

## 2019-03-07 DIAGNOSIS — M4317 Spondylolisthesis, lumbosacral region: Secondary | ICD-10-CM | POA: Diagnosis not present

## 2019-03-07 DIAGNOSIS — Z6826 Body mass index (BMI) 26.0-26.9, adult: Secondary | ICD-10-CM | POA: Insufficient documentation

## 2019-03-07 DIAGNOSIS — E785 Hyperlipidemia, unspecified: Secondary | ICD-10-CM | POA: Diagnosis not present

## 2019-03-07 DIAGNOSIS — I10 Essential (primary) hypertension: Secondary | ICD-10-CM | POA: Diagnosis not present

## 2019-03-07 DIAGNOSIS — Z87891 Personal history of nicotine dependence: Secondary | ICD-10-CM | POA: Insufficient documentation

## 2019-03-07 HISTORY — PX: TOTAL KNEE ARTHROPLASTY: SHX125

## 2019-03-07 LAB — TYPE AND SCREEN
ABO/RH(D): O POS
Antibody Screen: NEGATIVE

## 2019-03-07 SURGERY — ARTHROPLASTY, KNEE, TOTAL
Anesthesia: Spinal | Site: Knee | Laterality: Right

## 2019-03-07 MED ORDER — SODIUM CHLORIDE 0.9 % IR SOLN
Status: DC | PRN
  Administered 2019-03-07: 1000 mL

## 2019-03-07 MED ORDER — TRANEXAMIC ACID 1000 MG/10ML IV SOLN
INTRAVENOUS | Status: DC | PRN
  Administered 2019-03-07: 2000 mg via TOPICAL

## 2019-03-07 MED ORDER — VANCOMYCIN HCL IN DEXTROSE 1-5 GM/200ML-% IV SOLN
1000.0000 mg | INTRAVENOUS | Status: AC
  Administered 2019-03-07: 1000 mg via INTRAVENOUS

## 2019-03-07 MED ORDER — OXYCODONE HCL 5 MG/5ML PO SOLN: 5.0000 mg | Freq: Once | ORAL | Status: DC | PRN

## 2019-03-07 MED ORDER — TRANEXAMIC ACID-NACL 1000-0.7 MG/100ML-% IV SOLN
INTRAVENOUS | Status: AC
  Filled 2019-03-07: qty 100

## 2019-03-07 MED ORDER — LACTATED RINGERS IV BOLUS
500.0000 mL | Freq: Once | INTRAVENOUS | Status: AC
  Administered 2019-03-07: 500 mL via INTRAVENOUS

## 2019-03-07 MED ORDER — ONDANSETRON HCL 4 MG/2ML IJ SOLN: 4.0000 mg | Freq: Once | INTRAMUSCULAR | Status: DC | PRN

## 2019-03-07 MED ORDER — CLONIDINE HCL (ANALGESIA) 100 MCG/ML EP SOLN
EPIDURAL | Status: DC | PRN
  Administered 2019-03-07: 100 ug

## 2019-03-07 MED ORDER — ASPIRIN EC 81 MG PO TBEC: 81.0000 mg | DELAYED_RELEASE_TABLET | Freq: Two times a day (BID) | ORAL | 0 refills | Status: AC

## 2019-03-07 MED ORDER — ROPIVACAINE HCL 7.5 MG/ML IJ SOLN
INTRAMUSCULAR | Status: DC | PRN
  Administered 2019-03-07 (×4): 5 mL via PERINEURAL

## 2019-03-07 MED ORDER — DEXAMETHASONE SODIUM PHOSPHATE 10 MG/ML IJ SOLN
INTRAMUSCULAR | Status: DC | PRN
  Administered 2019-03-07: 10 mg via INTRAVENOUS

## 2019-03-07 MED ORDER — KETOROLAC TROMETHAMINE 30 MG/ML IJ SOLN
INTRAMUSCULAR | Status: AC
  Filled 2019-03-07: qty 1

## 2019-03-07 MED ORDER — WATER FOR IRRIGATION, STERILE IR SOLN
Status: DC | PRN
  Administered 2019-03-07: 2000 mL

## 2019-03-07 MED ORDER — BUPIVACAINE HCL (PF) 0.25 % IJ SOLN
INTRAMUSCULAR | Status: DC | PRN
  Administered 2019-03-07: 30 mL

## 2019-03-07 MED ORDER — ONDANSETRON HCL 4 MG/2ML IJ SOLN
INTRAMUSCULAR | Status: AC
  Filled 2019-03-07: qty 2

## 2019-03-07 MED ORDER — ACETAMINOPHEN 325 MG PO TABS: 325.0000 mg | ORAL_TABLET | ORAL | Status: DC | PRN

## 2019-03-07 MED ORDER — LACTATED RINGERS IV BOLUS
250.0000 mL | Freq: Once | INTRAVENOUS | Status: AC
  Administered 2019-03-07: 250 mL via INTRAVENOUS

## 2019-03-07 MED ORDER — OXYCODONE HCL 5 MG PO TABS
ORAL_TABLET | ORAL | Status: AC
  Filled 2019-03-07: qty 2

## 2019-03-07 MED ORDER — OXYCODONE HCL 5 MG PO TABS: 5.0000 mg | ORAL_TABLET | Freq: Once | ORAL | Status: DC | PRN

## 2019-03-07 MED ORDER — CHLORHEXIDINE GLUCONATE 4 % EX LIQD: 60.0000 mL | Freq: Once | CUTANEOUS | Status: DC

## 2019-03-07 MED ORDER — OXYCODONE HCL 5 MG PO TABS
5.0000 mg | ORAL_TABLET | ORAL | Status: DC | PRN
  Administered 2019-03-07: 10 mg via ORAL

## 2019-03-07 MED ORDER — DEXAMETHASONE SODIUM PHOSPHATE 10 MG/ML IJ SOLN
INTRAMUSCULAR | Status: AC
  Filled 2019-03-07: qty 1

## 2019-03-07 MED ORDER — MIDAZOLAM HCL 2 MG/2ML IJ SOLN
INTRAMUSCULAR | Status: AC
  Filled 2019-03-07: qty 2

## 2019-03-07 MED ORDER — ONDANSETRON HCL 8 MG PO TABS: 8.0000 mg | ORAL_TABLET | Freq: Three times a day (TID) | ORAL | 0 refills | Status: DC | PRN

## 2019-03-07 MED ORDER — KETOROLAC TROMETHAMINE 30 MG/ML IJ SOLN
30.0000 mg | Freq: Once | INTRAMUSCULAR | Status: AC | PRN
  Administered 2019-03-07: 30 mg via INTRAVENOUS

## 2019-03-07 MED ORDER — SODIUM CHLORIDE 0.9 % IV SOLN: INTRAVENOUS | Status: DC

## 2019-03-07 MED ORDER — PROPOFOL 10 MG/ML IV BOLUS
INTRAVENOUS | Status: AC
  Filled 2019-03-07: qty 120

## 2019-03-07 MED ORDER — ALBUMIN HUMAN 5 % IV SOLN
INTRAVENOUS | Status: AC
  Filled 2019-03-07: qty 500

## 2019-03-07 MED ORDER — TRANEXAMIC ACID-NACL 1000-0.7 MG/100ML-% IV SOLN
1000.0000 mg | INTRAVENOUS | Status: AC
  Administered 2019-03-07: 1000 mg via INTRAVENOUS

## 2019-03-07 MED ORDER — FENTANYL CITRATE (PF) 100 MCG/2ML IJ SOLN
INTRAMUSCULAR | Status: AC
  Filled 2019-03-07: qty 2

## 2019-03-07 MED ORDER — VANCOMYCIN HCL IN DEXTROSE 1-5 GM/200ML-% IV SOLN
INTRAVENOUS | Status: AC
  Filled 2019-03-07: qty 200

## 2019-03-07 MED ORDER — ROPIVACAINE HCL 5 MG/ML IJ SOLN
INTRAMUSCULAR | Status: DC | PRN
  Administered 2019-03-07 (×2): 5 mL via PERINEURAL

## 2019-03-07 MED ORDER — FENTANYL CITRATE (PF) 100 MCG/2ML IJ SOLN: 25.0000 ug | INTRAMUSCULAR | Status: DC | PRN

## 2019-03-07 MED ORDER — BUPIVACAINE HCL (PF) 0.75 % IJ SOLN
INTRAMUSCULAR | Status: DC | PRN
  Administered 2019-03-07: 1.4 mL via INTRATHECAL

## 2019-03-07 MED ORDER — TRANEXAMIC ACID-NACL 1000-0.7 MG/100ML-% IV SOLN: 1000.0000 mg | Freq: Once | INTRAVENOUS | Status: DC

## 2019-03-07 MED ORDER — POVIDONE-IODINE 10 % EX SWAB
2.0000 "application " | Freq: Once | CUTANEOUS | Status: AC
  Administered 2019-03-07: 2 via TOPICAL

## 2019-03-07 MED ORDER — MIDAZOLAM HCL 5 MG/5ML IJ SOLN
INTRAMUSCULAR | Status: DC | PRN
  Administered 2019-03-07: 2 mg via INTRAVENOUS

## 2019-03-07 MED ORDER — VANCOMYCIN HCL IN DEXTROSE 1-5 GM/200ML-% IV SOLN: 1000.0000 mg | Freq: Two times a day (BID) | INTRAVENOUS | Status: DC

## 2019-03-07 MED ORDER — ACETAMINOPHEN 160 MG/5ML PO SOLN: 325.0000 mg | ORAL | Status: DC | PRN

## 2019-03-07 MED ORDER — PHENYLEPHRINE 40 MCG/ML (10ML) SYRINGE FOR IV PUSH (FOR BLOOD PRESSURE SUPPORT)
PREFILLED_SYRINGE | INTRAVENOUS | Status: AC
  Filled 2019-03-07: qty 10

## 2019-03-07 MED ORDER — FENTANYL CITRATE (PF) 100 MCG/2ML IJ SOLN
INTRAMUSCULAR | Status: DC | PRN
  Administered 2019-03-07: 100 ug via INTRAVENOUS

## 2019-03-07 MED ORDER — OXYCODONE HCL 5 MG PO TABS: ORAL_TABLET | ORAL | 0 refills | Status: DC

## 2019-03-07 MED ORDER — BUPIVACAINE LIPOSOME 1.3 % IJ SUSP
INTRAMUSCULAR | Status: DC | PRN
  Administered 2019-03-07: 20 mL

## 2019-03-07 MED ORDER — LACTATED RINGERS IV SOLN: INTRAVENOUS | Status: DC

## 2019-03-07 MED ORDER — BUPIVACAINE HCL (PF) 0.25 % IJ SOLN
INTRAMUSCULAR | Status: AC
  Filled 2019-03-07: qty 30

## 2019-03-07 MED ORDER — ALBUMIN HUMAN 5 % IV SOLN
12.5000 g | Freq: Once | INTRAVENOUS | Status: AC
  Administered 2019-03-07: 12.5 g via INTRAVENOUS

## 2019-03-07 MED ORDER — PROPOFOL 500 MG/50ML IV EMUL
INTRAVENOUS | Status: DC | PRN
  Administered 2019-03-07: 50 ug/kg/min via INTRAVENOUS

## 2019-03-07 MED ORDER — PHENYLEPHRINE HCL-NACL 20-0.9 MG/250ML-% IV SOLN
INTRAVENOUS | Status: DC | PRN
  Administered 2019-03-07: 50 ug/min via INTRAVENOUS

## 2019-03-07 MED ORDER — SODIUM CHLORIDE (PF) 0.9 % IJ SOLN
INTRAMUSCULAR | Status: AC
  Filled 2019-03-07: qty 50

## 2019-03-07 MED ORDER — TIZANIDINE HCL 4 MG PO TABS: 4.0000 mg | ORAL_TABLET | Freq: Four times a day (QID) | ORAL | 1 refills | Status: DC | PRN

## 2019-03-07 MED ORDER — ONDANSETRON HCL 4 MG/2ML IJ SOLN
INTRAMUSCULAR | Status: DC | PRN
  Administered 2019-03-07: 4 mg via INTRAVENOUS

## 2019-03-07 MED ORDER — MEPERIDINE HCL 50 MG/ML IJ SOLN: 6.2500 mg | INTRAMUSCULAR | Status: DC | PRN

## 2019-03-07 MED ORDER — SODIUM CHLORIDE 0.9% FLUSH
INTRAVENOUS | Status: DC | PRN
  Administered 2019-03-07: 50 mL

## 2019-03-07 SURGICAL SUPPLY — 66 items
ATTUNE MED DOME PAT 38 KNEE (Knees) ×1 IMPLANT
ATTUNE MED DOME PAT 38MM KNEE (Knees) ×1 IMPLANT
ATTUNE PS FEM RT SZ 8 CEM KNEE (Femur) ×2 IMPLANT
ATTUNE PSRP INSR SZ8 6 KNEE (Insert) ×1 IMPLANT
ATTUNE PSRP INSR SZ8 6MM KNEE (Insert) ×1 IMPLANT
BAG DECANTER FOR FLEXI CONT (MISCELLANEOUS) ×3 IMPLANT
BAG ZIPLOCK 12X15 (MISCELLANEOUS) ×3 IMPLANT
BASE TIBIAL ROT PLAT SZ 8 KNEE (Knees) IMPLANT
BENZOIN TINCTURE PRP APPL 2/3 (GAUZE/BANDAGES/DRESSINGS) ×3 IMPLANT
BLADE SAGITTAL 25.0X1.19X90 (BLADE) ×2 IMPLANT
BLADE SAGITTAL 25.0X1.19X90MM (BLADE) ×1
BLADE SAW SGTL 13X75X1.27 (BLADE) ×3 IMPLANT
BLADE SURG 15 STRL LF DISP TIS (BLADE) ×1 IMPLANT
BLADE SURG 15 STRL SS (BLADE) ×2
BLADE SURG SZ10 CARB STEEL (BLADE) ×3 IMPLANT
BNDG ELASTIC 6X15 VLCR STRL LF (GAUZE/BANDAGES/DRESSINGS) ×3 IMPLANT
BOWL SMART MIX CTS (DISPOSABLE) ×3 IMPLANT
CEMENT HV SMART SET (Cement) ×4 IMPLANT
CLOSURE STERI-STRIP 1/2X4 (GAUZE/BANDAGES/DRESSINGS) ×1
CLOSURE WOUND 1/2 X4 (GAUZE/BANDAGES/DRESSINGS) ×1
CLSR STERI-STRIP ANTIMIC 1/2X4 (GAUZE/BANDAGES/DRESSINGS) ×3 IMPLANT
COVER SURGICAL LIGHT HANDLE (MISCELLANEOUS) ×3 IMPLANT
COVER WAND RF STERILE (DRAPES) ×3 IMPLANT
CUFF TOURN SGL QUICK 34 (TOURNIQUET CUFF) ×2
CUFF TRNQT CYL 34X4.125X (TOURNIQUET CUFF) ×1 IMPLANT
DECANTER SPIKE VIAL GLASS SM (MISCELLANEOUS) ×4 IMPLANT
DRAPE INCISE IOBAN 66X45 STRL (DRAPES) IMPLANT
DRAPE U-SHAPE 47X51 STRL (DRAPES) ×3 IMPLANT
DRESSING AQUACEL AG SP 3.5X10 (GAUZE/BANDAGES/DRESSINGS) ×1 IMPLANT
DRSG AQUACEL AG ADV 3.5X10 (GAUZE/BANDAGES/DRESSINGS) ×2 IMPLANT
DRSG AQUACEL AG SP 3.5X10 (GAUZE/BANDAGES/DRESSINGS) ×3
DURAPREP 26ML APPLICATOR (WOUND CARE) ×6 IMPLANT
ELECT REM PT RETURN 15FT ADLT (MISCELLANEOUS) ×3 IMPLANT
GLOVE BIOGEL PI IND STRL 8 (GLOVE) ×2 IMPLANT
GLOVE BIOGEL PI INDICATOR 8 (GLOVE) ×4
GLOVE SURG ORTHO 8.0 STRL STRW (GLOVE) ×3 IMPLANT
GLOVE SURG SS PI 7.5 STRL IVOR (GLOVE) ×3 IMPLANT
GOWN STRL REUS W/TWL XL LVL3 (GOWN DISPOSABLE) ×6 IMPLANT
HANDPIECE INTERPULSE COAX TIP (DISPOSABLE) ×2
HOLDER FOLEY CATH W/STRAP (MISCELLANEOUS) IMPLANT
HOOD PEEL AWAY FLYTE STAYCOOL (MISCELLANEOUS) ×3 IMPLANT
IMMOBILIZER KNEE 20 (SOFTGOODS) ×3
IMMOBILIZER KNEE 20 THIGH 36 (SOFTGOODS) ×1 IMPLANT
KIT TURNOVER KIT A (KITS) IMPLANT
MANIFOLD NEPTUNE II (INSTRUMENTS) ×3 IMPLANT
NEEDLE HYPO 22GX1.5 SAFETY (NEEDLE) ×6 IMPLANT
NS IRRIG 1000ML POUR BTL (IV SOLUTION) ×3 IMPLANT
PACK ICE MAXI GEL EZY WRAP (MISCELLANEOUS) ×3 IMPLANT
PACK TOTAL KNEE CUSTOM (KITS) ×3 IMPLANT
PENCIL SMOKE EVACUATOR (MISCELLANEOUS) ×2 IMPLANT
PIN DRILL FIX HALF THREAD (BIT) ×2 IMPLANT
PIN STEINMAN FIXATION KNEE (PIN) ×2 IMPLANT
PROTECTOR NERVE ULNAR (MISCELLANEOUS) ×3 IMPLANT
SET HNDPC FAN SPRY TIP SCT (DISPOSABLE) ×1 IMPLANT
STRIP CLOSURE SKIN 1/2X4 (GAUZE/BANDAGES/DRESSINGS) ×1 IMPLANT
SUT ETHIBOND NAB CT1 #1 30IN (SUTURE) ×6 IMPLANT
SUT MNCRL AB 3-0 PS2 18 (SUTURE) ×3 IMPLANT
SUT VIC AB 0 CT1 36 (SUTURE) ×3 IMPLANT
SUT VIC AB 2-0 CT1 27 (SUTURE) ×4
SUT VIC AB 2-0 CT1 TAPERPNT 27 (SUTURE) ×2 IMPLANT
SYR CONTROL 10ML LL (SYRINGE) ×9 IMPLANT
TIBIAL BASE ROT PLAT SZ 8 KNEE (Knees) ×3 IMPLANT
TRAY FOLEY MTR SLVR 16FR STAT (SET/KITS/TRAYS/PACK) ×3 IMPLANT
WATER STERILE IRR 1000ML POUR (IV SOLUTION) ×6 IMPLANT
WRAP KNEE MAXI GEL POST OP (GAUZE/BANDAGES/DRESSINGS) ×2 IMPLANT
YANKAUER SUCT BULB TIP 10FT TU (MISCELLANEOUS) ×3 IMPLANT

## 2019-03-07 NOTE — Interval H&P Note (Signed)
History and Physical Interval Note:  03/07/2019 7:30 AM  Lawrence White  has presented today for surgery, with the diagnosis of OSTEOARTHRITIS RIGHT KNEE.  The various methods of treatment have been discussed with the patient and family. After consideration of risks, benefits and other options for treatment, the patient has consented to  Procedure(s): TOTAL KNEE ARTHROPLASTY (Right) as a surgical intervention.  The patient's history has been reviewed, patient examined, no change in status, stable for surgery.  I have reviewed the patient's chart and labs.  Questions were answered to the patient's satisfaction.     Thera Flake

## 2019-03-07 NOTE — Brief Op Note (Signed)
03/07/2019  9:58 AM  PATIENT:  Lawrence White  69 y.o. male  PRE-OPERATIVE DIAGNOSIS:  OSTEOARTHRITIS RIGHT KNEE  POST-OPERATIVE DIAGNOSIS:  OSTEOARTHRITIS RIGHT KNEE  PROCEDURE:  Procedure(s): TOTAL KNEE ARTHROPLASTY (Right)  SURGEON:  Surgeon(s) and Role:    * Frederico Hamman, MD - Primary  PHYSICIAN ASSISTANT: Margart Sickles, PA-C  ASSISTANTS: OR staff x1   ANESTHESIA:   local, regional, spinal and IV sedation  EBL:  25 mL   BLOOD ADMINISTERED:none  DRAINS: none   LOCAL MEDICATIONS USED:  MARCAINE     SPECIMEN:  No Specimen  DISPOSITION OF SPECIMEN:  N/A  COUNTS:  YES  TOURNIQUET:   Total Tourniquet Time Documented: Thigh (Right) - 61 minutes Total: Thigh (Right) - 61 minutes   DICTATION: .Other Dictation: Dictation Number unknown  PLAN OF CARE: Discharge to home after PACU  PATIENT DISPOSITION:  PACU - hemodynamically stable.   Delay start of Pharmacological VTE agent (>24hrs) due to surgical blood loss or risk of bleeding: yes

## 2019-03-07 NOTE — Evaluation (Signed)
Physical Therapy Evaluation Patient Details Name: Lawrence White MRN: [id number] DOB: 10/04/1950 Today's Date: 03/07/2019   History of Present Illness  Patient is 69 y.o. male s/p Rt TKA on 03/07/19 with PMH significant for HTN, HLD, Lt foot drop, and lumbar fusion.  Clinical Impression  Lawrence White is a 70 y.o. male POD 0 s/p Rt TKA. Patient reports independence with mobility at baseline. Patient is now limited by functional impairments (see PT problem list below) and requires supervision for transfers and gait with RW. Patient was able to ambulate ~90 feet with RW and supervision and cues for safe walker management. Patient educated on safe sequencing for stair mobility and verbalized safe guarding position for people assisting with mobility. Patient instructed in exercises to facilitate ROM and circulation. Patient will benefit from continued skilled PT interventions to address impairments and progress towards PLOF. Patient has met mobility goals at adequate level for discharge home; will continue to follow if pt continues acute stay to progress towards Mod I goals.     Follow Up Recommendations Follow surgeon's recommendation for DC plan and follow-up therapies    Equipment Recommendations  None recommended by PT    Recommendations for Other Services       Precautions / Restrictions Precautions Precautions: Fall Restrictions Weight Bearing Restrictions: No      Mobility  Bed Mobility Overal bed mobility: Needs Assistance Bed Mobility: Supine to Sit;Sit to Supine     Supine to sit: Supervision;HOB elevated Sit to supine: Supervision;HOB elevated   General bed mobility comments: cues for use of belt to assist Rt LE  Transfers Overall transfer level: Needs assistance Equipment used: Rolling walker (2 wheeled) Transfers: Sit to/from [facility] Sit to Stand: Supervision Stand pivot transfers: Supervision       General transfer comment: cues for safe hand  placement and technique with RW, no assist required, cues for step pattern to [facility]  Ambulation/Gait Ambulation/Gait assistance: Supervision Gait Distance (Feet): 90 Feet Assistive device: Rolling walker (2 wheeled) Gait Pattern/deviations: Step-to pattern;Decreased step length - left;Decreased stance time - right;Decreased stride length Gait velocity: decreased   General Gait Details: cues for safe proximity to RW throughout, no overt LOB noted, cues for safe step pattern  Stairs Stairs: Yes Stairs assistance: Supervision Stair Management: No rails;Step to pattern;With walker;Backwards Number of Stairs: 4(2x2) General stair comments: cues for safe step pattern with RW, "up with good, down with bad" pt verbalized safe guarding position for person(s) assisting him  Wheelchair Mobility    Modified Rankin (Stroke Patients Only)       Balance Overall balance assessment: Needs assistance Sitting-balance support: Feet supported Sitting balance-Leahy Scale: Good     Standing balance support: During functional activity;Bilateral upper extremity supported Standing balance-Leahy Scale: Fair                Pertinent Vitals/Pain Pain Assessment: 0-10 Pain Score: 5  Pain Location: Rt knee Pain Descriptors / Indicators: Aching;Sore Pain Intervention(s): Limited activity within patient's tolerance;Monitored during session    [place] expects to be discharged to:: Private residence Living Arrangements: Spouse/significant other Available Help at Discharge: Family;Available 24 hours/day Type of Home: House Home Access: Stairs to enter Entrance Stairs-Rails: None Entrance Stairs-Number of Steps: 2 Home Layout: Two level Home Equipment: Walker - 2 wheels;Cane - single point;Bedside commode Additional Comments: pt does not need to go down stairs to basement    Prior Function Level of Independence: Independent  Hand Dominance   Dominant  Hand: Right    Extremity/Trunk Assessment   Upper Extremity Assessment Upper Extremity Assessment: Overall WFL for tasks assessed    Lower Extremity Assessment Lower Extremity Assessment: RLE deficits/detail RLE Deficits / Details: good quad activation, no extensor lag with SLR RLE Sensation: WNL RLE Coordination: WNL    Cervical / Trunk Assessment Cervical / Trunk Assessment: Normal  Communication   Communication: No difficulties  Cognition Arousal/Alertness: Awake/alert Behavior During Therapy: WFL for tasks assessed/performed Overall Cognitive Status: Within Functional Limits for tasks assessed               General Comments      Exercises Total Joint Exercises Ankle Circles/Pumps: AROM;Both;Supine;10 reps Quad Sets: AROM;5 reps;Supine;Right Short Arc Quad: AROM;5 reps;Supine;Right Heel Slides: 5 reps;AAROM;Supine;Right Hip ABduction/ADduction: AROM;5 reps;Supine;Right Straight Leg Raises: AROM;5 reps;Supine;Right Long [employer]: AROM;5 reps;Seated;Right Knee Flexion: AAROM;AROM;10 reps;Seated;Right   Assessment/Plan    PT Assessment Patient needs continued PT services  PT Problem List Decreased strength;Decreased balance;Decreased mobility;Decreased range of motion;Decreased activity tolerance;Decreased knowledge of use of DME       PT Treatment Interventions Gait training;Functional mobility training;DME instruction;Balance training;Patient/family education;Therapeutic activities;Therapeutic exercise;Stair training    PT Goals (Current goals can be found in the Care Plan section)  Acute Rehab PT Goals Patient Stated Goal: to get independent and get back to fly fishing PT Goal Formulation: With patient Time For Goal Achievement: 03/14/19 Potential to Achieve Goals: Good    Frequency 7X/week    AM-PAC PT "6 Clicks" Mobility  Outcome Measure Help needed turning from your back to your side while in a flat bed without using bedrails?: None Help needed  moving from lying on your back to sitting on the side of a flat bed without using bedrails?: None Help needed moving to and from a bed to a chair (including a wheelchair)?: A Little Help needed standing up from a chair using your arms (e.g., wheelchair or bedside chair)?: A Little Help needed to walk in hospital room?: A Little Help needed climbing 3-5 steps with a railing? : A Little 6 Click Score: 20    End of Session Equipment Utilized During Treatment: Gait belt Activity Tolerance: Patient tolerated treatment well Patient left: with call bell/phone within reach;in bed Nurse Communication: Mobility status PT Visit Diagnosis: Difficulty in walking, not elsewhere classified (R26.2);Muscle weakness (generalized) (M62.81)    Time: 1253-1350 PT Time Calculation (min) (ACUTE ONLY): 57 min   Charges:   PT Evaluation $PT Eval Low Complexity: 1 Low PT Treatments $Gait Training: 8-22 mins $Therapeutic Exercise: 8-22 mins $Therapeutic Activity: 8-22 mins        Verner Mould, DPT Physical Therapist with [facility] [phone]  03/07/2019 3:08 PM

## 2019-03-07 NOTE — Op Note (Signed)
NAME: RONTE, PARKER MEDICAL RECORD RC:[id number] ACCOUNT [id number] DATE OF BIRTH:August 01, 1950 FACILITY: [facility] LOCATION: [facility] PHYSICIAN:W. Miner Koral JR., MD  OPERATIVE REPORT  DATE OF PROCEDURE:  03/07/2019  PREOPERATIVE DIAGNOSIS:  Severe osteoarthritis, right knee with varus deformity.  POSTOPERATIVE DIAGNOSIS:  Severe osteoarthritis, right knee with varus deformity.  OPERATION:  Right total knee replacement (Attune DePuy knee, size 8 femur/tibia, 6 mm tibial bearing and 38 mm all poly patella).  SURGEON:  Marcie Mowers, MD  ASSISTANT:  Margart Sickles, PA  ANESTHESIA:  Spinal with an adductor block.  DESCRIPTION OF PROCEDURE:  In the supine positioning, thigh tourniquet exsanguination of the leg inflation of thigh tourniquet 350, straight skin incision with medial parapatellar approach to the knee was made.  We did a 5-degree 9 mm distal femoral cut  followed by cutting about 9 mm below the least diseased lateral compartment with the provisional second cut 2 additional millimeters, with the extension gap being measured at eventually 6 mm.  Femur was sized to be a size 8 with placement of all-in-one  cutting block.  Appropriate degree of external rotation with accomplishment of the anterior, posterior chamfer cuts.  Tibia was sized to be a size 8, followed by placement of the keel for the tibia, placement of the trial baseplate and the box cut on the  femur.  The patient's patella measured 26 mm.  We did a 9.5 mm resection, placed a 38 mm all poly trial.  All trials were placed.  We did settle on the 6 mm thick bearing, obtained full extension with excellent stability medial and laterally.  No  tendency for bearing subluxation or spinout.  Cement was prepared on the back table.  We did pulsatile lavage on the bone.  Soft tissues were infiltrated with a mixture of Exparel and Marcaine additionally using TXA topically on the soft tissues.   Components were inserted in tibia,  followed by femur and patella.  Cement was allowed to harden with the trial bearing.  Trial bearing was removed.  No excess cement was noted at the posterior aspect of the knee.  Tourniquet was released with the trial  bearing out.  No excessive bleeding was noted.  Small bleeders were coagulated.  Final bearing was placed.  All parameters deemed to be acceptable.  Closure was affected with #1 Ethibond, 2-0 Vicryl and Monocryl in the skin.  A lightly compressive  sterile dressing and knee immobilizer applied, taken to recovery room in stable condition.  JN/NUANCE  D:03/07/2019 T:03/07/2019 JOB:[id number]

## 2019-03-07 NOTE — Progress Notes (Signed)
Orthopedic Tech Progress Note Patient Details:  Lawrence White 10-Apr-1950 [id number]  Ortho Devices Type of Ortho Device: Bone foam zero knee Ortho Device/Splint Interventions: Ordered   Post Interventions Instructions Provided: Care of device   Ancil Linsey 03/07/2019, 4:01 PM

## 2019-03-07 NOTE — Transfer of Care (Signed)
2Immediate Anesthesia Transfer of Care Note  Patient: Lawrence White  Procedure(s) Performed: TOTAL KNEE ARTHROPLASTY (Right Knee)  Patient Location: PACU  Anesthesia Type:Regional, Spinal and MAC combined with regional for post-op pain  Level of Consciousness: drowsy and responds to stimulation  Airway & Oxygen Therapy: Patient connected to face mask oxygen  Post-op Assessment: Report given to RN and Post -op Vital signs reviewed and stable  Post vital signs: stable  Last Vitals:  Vitals Value Taken Time  BP 97/67 03/07/19 0952  Temp    Pulse 59 03/07/19 0953  Resp 15 03/07/19 0953  SpO2 99 % 03/07/19 0953  Vitals shown include unvalidated device data.  Last Pain:  Vitals:   03/07/19 0547  TempSrc:   PainSc: 3       Patients Stated Pain Goal: 2 (32/76/14 7092)  Complications: No apparent anesthesia complications

## 2019-03-07 NOTE — Anesthesia Postprocedure Evaluation (Signed)
Anesthesia Post Note  Patient: Terald Sleeper  Procedure(s) Performed: TOTAL KNEE ARTHROPLASTY (Right Knee)     Patient location during evaluation: PACU Anesthesia Type: Spinal Level of consciousness: awake Pain management: pain level controlled Vital Signs Assessment: post-procedure vital signs reviewed and stable Respiratory status: spontaneous breathing Cardiovascular status: stable Postop Assessment: no headache, no backache, spinal receding, patient able to bend at knees and no apparent nausea or vomiting Anesthetic complications: no    Last Vitals:  Vitals:   03/07/19 1015 03/07/19 1030  BP: 93/60 91/62  Pulse: 62 (!) 56  Resp: 18 13  Temp:    SpO2: 99% 94%    Last Pain:  Vitals:   03/07/19 1015  TempSrc:   PainSc: 0-No pain   Pain Goal: Patients Stated Pain Goal: 2 (03/07/19 0547)  LLE Motor Response: Purposeful movement (03/07/19 1015) LLE Sensation: Numbness (03/07/19 1015) RLE Motor Response: Purposeful movement (03/07/19 1015) RLE Sensation: Numbness (03/07/19 1015) L Sensory Level: L4-Anterior knee, lower leg (03/07/19 1015) R Sensory Level: S1-Sole of foot, small toes (03/07/19 1015) Epidural/Spinal Function Patient able to flex knees: Yes (03/07/19 1015)  Caren Macadam

## 2019-03-07 NOTE — Anesthesia Procedure Notes (Signed)
Spinal  Patient location during procedure: OR Start time: 03/07/2019 7:41 AM End time: 03/07/2019 7:45 AM Staffing Performed: anesthesiologist  Anesthesiologist: Leilani Able, MD Preanesthetic Checklist Completed: patient identified, IV checked, site marked, risks and benefits discussed, surgical consent, monitors and equipment checked, pre-op evaluation and timeout performed Spinal Block Patient position: sitting Prep: DuraPrep and site prepped and draped Patient monitoring: continuous pulse ox and blood pressure Approach: midline Location: L2-3 Injection technique: single-shot Needle Needle type: Pencan  Needle gauge: 24 G Needle length: 10 cm Needle insertion depth: 7 cm Assessment Sensory level: T8

## 2019-03-07 NOTE — Discharge Instructions (Signed)

## 2019-03-07 NOTE — Anesthesia Procedure Notes (Addendum)
Anesthesia Regional Block: Adductor canal block   Pre-Anesthetic Checklist: ,, timeout performed, Correct Patient, Correct Site, Correct Laterality, Correct Procedure, Correct Position, site marked, Risks and benefits discussed,  Surgical consent,  Pre-op evaluation,  At surgeon's request and post-op pain management  Laterality: Lower and Right  Prep: chloraprep       Needles:  Injection technique: Single-shot  Needle Type: Echogenic Stimulator Needle     Needle Length: 10cm  Needle Gauge: 21   Needle insertion depth: 2.5 cm   Additional Needles:   Procedures:,,,, ultrasound used (permanent image in chart),,,,  Narrative:  Start time: 03/07/2019 7:00 AM End time: 03/07/2019 7:07 AM Injection made incrementally with aspirations every 5 mL.  Performed by: Personally  Anesthesiologist: Leilani Able, MD

## 2019-03-09 DIAGNOSIS — M47812 Spondylosis without myelopathy or radiculopathy, cervical region: Secondary | ICD-10-CM | POA: Diagnosis not present

## 2019-03-09 DIAGNOSIS — E785 Hyperlipidemia, unspecified: Secondary | ICD-10-CM | POA: Diagnosis not present

## 2019-03-09 DIAGNOSIS — M48061 Spinal stenosis, lumbar region without neurogenic claudication: Secondary | ICD-10-CM | POA: Diagnosis not present

## 2019-03-09 DIAGNOSIS — G629 Polyneuropathy, unspecified: Secondary | ICD-10-CM | POA: Diagnosis not present

## 2019-03-09 DIAGNOSIS — Z471 Aftercare following joint replacement surgery: Secondary | ICD-10-CM | POA: Diagnosis not present

## 2019-03-09 DIAGNOSIS — Z9181 History of falling: Secondary | ICD-10-CM | POA: Diagnosis not present

## 2019-03-09 DIAGNOSIS — Z7982 Long term (current) use of aspirin: Secondary | ICD-10-CM | POA: Diagnosis not present

## 2019-03-09 DIAGNOSIS — M21372 Foot drop, left foot: Secondary | ICD-10-CM | POA: Diagnosis not present

## 2019-03-09 DIAGNOSIS — K219 Gastro-esophageal reflux disease without esophagitis: Secondary | ICD-10-CM | POA: Diagnosis not present

## 2019-03-09 DIAGNOSIS — Z7951 Long term (current) use of inhaled steroids: Secondary | ICD-10-CM | POA: Diagnosis not present

## 2019-03-09 DIAGNOSIS — I10 Essential (primary) hypertension: Secondary | ICD-10-CM | POA: Diagnosis not present

## 2019-03-09 DIAGNOSIS — Z96651 Presence of right artificial knee joint: Secondary | ICD-10-CM | POA: Diagnosis not present

## 2019-03-09 DIAGNOSIS — M4317 Spondylolisthesis, lumbosacral region: Secondary | ICD-10-CM | POA: Diagnosis not present

## 2019-03-09 DIAGNOSIS — H9190 Unspecified hearing loss, unspecified ear: Secondary | ICD-10-CM | POA: Diagnosis not present

## 2019-03-09 DIAGNOSIS — Z87891 Personal history of nicotine dependence: Secondary | ICD-10-CM | POA: Diagnosis not present

## 2019-03-10 ENCOUNTER — Encounter: Payer: Self-pay | Admitting: *Deleted

## 2019-03-11 DIAGNOSIS — Z471 Aftercare following joint replacement surgery: Secondary | ICD-10-CM | POA: Diagnosis not present

## 2019-03-11 DIAGNOSIS — G629 Polyneuropathy, unspecified: Secondary | ICD-10-CM | POA: Diagnosis not present

## 2019-03-11 DIAGNOSIS — M48061 Spinal stenosis, lumbar region without neurogenic claudication: Secondary | ICD-10-CM | POA: Diagnosis not present

## 2019-03-11 DIAGNOSIS — M47812 Spondylosis without myelopathy or radiculopathy, cervical region: Secondary | ICD-10-CM | POA: Diagnosis not present

## 2019-03-11 DIAGNOSIS — M21372 Foot drop, left foot: Secondary | ICD-10-CM | POA: Diagnosis not present

## 2019-03-11 DIAGNOSIS — M4317 Spondylolisthesis, lumbosacral region: Secondary | ICD-10-CM | POA: Diagnosis not present

## 2019-03-14 DIAGNOSIS — Z471 Aftercare following joint replacement surgery: Secondary | ICD-10-CM | POA: Diagnosis not present

## 2019-03-14 DIAGNOSIS — M48061 Spinal stenosis, lumbar region without neurogenic claudication: Secondary | ICD-10-CM | POA: Diagnosis not present

## 2019-03-14 DIAGNOSIS — M21372 Foot drop, left foot: Secondary | ICD-10-CM | POA: Diagnosis not present

## 2019-03-14 DIAGNOSIS — G629 Polyneuropathy, unspecified: Secondary | ICD-10-CM | POA: Diagnosis not present

## 2019-03-14 DIAGNOSIS — M47812 Spondylosis without myelopathy or radiculopathy, cervical region: Secondary | ICD-10-CM | POA: Diagnosis not present

## 2019-03-14 DIAGNOSIS — M4317 Spondylolisthesis, lumbosacral region: Secondary | ICD-10-CM | POA: Diagnosis not present

## 2019-03-19 DIAGNOSIS — M47812 Spondylosis without myelopathy or radiculopathy, cervical region: Secondary | ICD-10-CM | POA: Diagnosis not present

## 2019-03-19 DIAGNOSIS — Z471 Aftercare following joint replacement surgery: Secondary | ICD-10-CM | POA: Diagnosis not present

## 2019-03-19 DIAGNOSIS — M48061 Spinal stenosis, lumbar region without neurogenic claudication: Secondary | ICD-10-CM | POA: Diagnosis not present

## 2019-03-19 DIAGNOSIS — G629 Polyneuropathy, unspecified: Secondary | ICD-10-CM | POA: Diagnosis not present

## 2019-03-19 DIAGNOSIS — M4317 Spondylolisthesis, lumbosacral region: Secondary | ICD-10-CM | POA: Diagnosis not present

## 2019-03-19 DIAGNOSIS — M21372 Foot drop, left foot: Secondary | ICD-10-CM | POA: Diagnosis not present

## 2019-03-21 DIAGNOSIS — M48061 Spinal stenosis, lumbar region without neurogenic claudication: Secondary | ICD-10-CM | POA: Diagnosis not present

## 2019-03-21 DIAGNOSIS — M4317 Spondylolisthesis, lumbosacral region: Secondary | ICD-10-CM | POA: Diagnosis not present

## 2019-03-21 DIAGNOSIS — G629 Polyneuropathy, unspecified: Secondary | ICD-10-CM | POA: Diagnosis not present

## 2019-03-21 DIAGNOSIS — M21372 Foot drop, left foot: Secondary | ICD-10-CM | POA: Diagnosis not present

## 2019-03-21 DIAGNOSIS — M47812 Spondylosis without myelopathy or radiculopathy, cervical region: Secondary | ICD-10-CM | POA: Diagnosis not present

## 2019-03-21 DIAGNOSIS — Z471 Aftercare following joint replacement surgery: Secondary | ICD-10-CM | POA: Diagnosis not present

## 2019-03-25 DIAGNOSIS — M25561 Pain in right knee: Secondary | ICD-10-CM | POA: Diagnosis not present

## 2019-03-25 DIAGNOSIS — M6281 Muscle weakness (generalized): Secondary | ICD-10-CM | POA: Diagnosis not present

## 2019-03-25 DIAGNOSIS — M1711 Unilateral primary osteoarthritis, right knee: Secondary | ICD-10-CM | POA: Diagnosis not present

## 2019-03-25 DIAGNOSIS — M25661 Stiffness of right knee, not elsewhere classified: Secondary | ICD-10-CM | POA: Diagnosis not present

## 2019-03-25 DIAGNOSIS — R262 Difficulty in walking, not elsewhere classified: Secondary | ICD-10-CM | POA: Diagnosis not present

## 2019-03-28 DIAGNOSIS — M25661 Stiffness of right knee, not elsewhere classified: Secondary | ICD-10-CM | POA: Diagnosis not present

## 2019-03-28 DIAGNOSIS — M25561 Pain in right knee: Secondary | ICD-10-CM | POA: Diagnosis not present

## 2019-03-28 DIAGNOSIS — R262 Difficulty in walking, not elsewhere classified: Secondary | ICD-10-CM | POA: Diagnosis not present

## 2019-03-28 DIAGNOSIS — M6281 Muscle weakness (generalized): Secondary | ICD-10-CM | POA: Diagnosis not present

## 2019-04-01 DIAGNOSIS — M6281 Muscle weakness (generalized): Secondary | ICD-10-CM | POA: Diagnosis not present

## 2019-04-01 DIAGNOSIS — M25661 Stiffness of right knee, not elsewhere classified: Secondary | ICD-10-CM | POA: Diagnosis not present

## 2019-04-01 DIAGNOSIS — M25561 Pain in right knee: Secondary | ICD-10-CM | POA: Diagnosis not present

## 2019-04-01 DIAGNOSIS — R262 Difficulty in walking, not elsewhere classified: Secondary | ICD-10-CM | POA: Diagnosis not present

## 2019-04-04 DIAGNOSIS — M25561 Pain in right knee: Secondary | ICD-10-CM | POA: Diagnosis not present

## 2019-04-04 DIAGNOSIS — M6281 Muscle weakness (generalized): Secondary | ICD-10-CM | POA: Diagnosis not present

## 2019-04-04 DIAGNOSIS — R262 Difficulty in walking, not elsewhere classified: Secondary | ICD-10-CM | POA: Diagnosis not present

## 2019-04-04 DIAGNOSIS — M25661 Stiffness of right knee, not elsewhere classified: Secondary | ICD-10-CM | POA: Diagnosis not present

## 2019-04-08 ENCOUNTER — Ambulatory Visit: Payer: Medicare Other

## 2019-04-08 DIAGNOSIS — M25661 Stiffness of right knee, not elsewhere classified: Secondary | ICD-10-CM | POA: Diagnosis not present

## 2019-04-08 DIAGNOSIS — M6281 Muscle weakness (generalized): Secondary | ICD-10-CM | POA: Diagnosis not present

## 2019-04-08 DIAGNOSIS — R262 Difficulty in walking, not elsewhere classified: Secondary | ICD-10-CM | POA: Diagnosis not present

## 2019-04-08 DIAGNOSIS — M25561 Pain in right knee: Secondary | ICD-10-CM | POA: Diagnosis not present

## 2019-04-08 DIAGNOSIS — Z471 Aftercare following joint replacement surgery: Secondary | ICD-10-CM | POA: Diagnosis not present

## 2019-04-11 DIAGNOSIS — M25561 Pain in right knee: Secondary | ICD-10-CM | POA: Diagnosis not present

## 2019-04-11 DIAGNOSIS — R262 Difficulty in walking, not elsewhere classified: Secondary | ICD-10-CM | POA: Diagnosis not present

## 2019-04-11 DIAGNOSIS — M6281 Muscle weakness (generalized): Secondary | ICD-10-CM | POA: Diagnosis not present

## 2019-04-11 DIAGNOSIS — M25661 Stiffness of right knee, not elsewhere classified: Secondary | ICD-10-CM | POA: Diagnosis not present

## 2019-04-15 DIAGNOSIS — M6281 Muscle weakness (generalized): Secondary | ICD-10-CM | POA: Diagnosis not present

## 2019-04-15 DIAGNOSIS — M25561 Pain in right knee: Secondary | ICD-10-CM | POA: Diagnosis not present

## 2019-04-15 DIAGNOSIS — R262 Difficulty in walking, not elsewhere classified: Secondary | ICD-10-CM | POA: Diagnosis not present

## 2019-04-15 DIAGNOSIS — M25661 Stiffness of right knee, not elsewhere classified: Secondary | ICD-10-CM | POA: Diagnosis not present

## 2019-04-16 DIAGNOSIS — Z23 Encounter for immunization: Secondary | ICD-10-CM | POA: Diagnosis not present

## 2019-04-18 DIAGNOSIS — M25561 Pain in right knee: Secondary | ICD-10-CM | POA: Diagnosis not present

## 2019-04-18 DIAGNOSIS — M25661 Stiffness of right knee, not elsewhere classified: Secondary | ICD-10-CM | POA: Diagnosis not present

## 2019-04-18 DIAGNOSIS — M6281 Muscle weakness (generalized): Secondary | ICD-10-CM | POA: Diagnosis not present

## 2019-04-18 DIAGNOSIS — R262 Difficulty in walking, not elsewhere classified: Secondary | ICD-10-CM | POA: Diagnosis not present

## 2019-04-22 DIAGNOSIS — M6281 Muscle weakness (generalized): Secondary | ICD-10-CM | POA: Diagnosis not present

## 2019-04-22 DIAGNOSIS — M25561 Pain in right knee: Secondary | ICD-10-CM | POA: Diagnosis not present

## 2019-04-22 DIAGNOSIS — M25661 Stiffness of right knee, not elsewhere classified: Secondary | ICD-10-CM | POA: Diagnosis not present

## 2019-04-22 DIAGNOSIS — R262 Difficulty in walking, not elsewhere classified: Secondary | ICD-10-CM | POA: Diagnosis not present

## 2019-04-24 DIAGNOSIS — M25561 Pain in right knee: Secondary | ICD-10-CM | POA: Diagnosis not present

## 2019-04-25 DIAGNOSIS — M6281 Muscle weakness (generalized): Secondary | ICD-10-CM | POA: Diagnosis not present

## 2019-04-25 DIAGNOSIS — R262 Difficulty in walking, not elsewhere classified: Secondary | ICD-10-CM | POA: Diagnosis not present

## 2019-04-25 DIAGNOSIS — M25561 Pain in right knee: Secondary | ICD-10-CM | POA: Diagnosis not present

## 2019-04-25 DIAGNOSIS — M25661 Stiffness of right knee, not elsewhere classified: Secondary | ICD-10-CM | POA: Diagnosis not present

## 2019-04-29 DIAGNOSIS — R262 Difficulty in walking, not elsewhere classified: Secondary | ICD-10-CM | POA: Diagnosis not present

## 2019-04-29 DIAGNOSIS — M25561 Pain in right knee: Secondary | ICD-10-CM | POA: Diagnosis not present

## 2019-04-29 DIAGNOSIS — M25661 Stiffness of right knee, not elsewhere classified: Secondary | ICD-10-CM | POA: Diagnosis not present

## 2019-04-29 DIAGNOSIS — M6281 Muscle weakness (generalized): Secondary | ICD-10-CM | POA: Diagnosis not present

## 2019-05-02 DIAGNOSIS — M25561 Pain in right knee: Secondary | ICD-10-CM | POA: Diagnosis not present

## 2019-05-02 DIAGNOSIS — M25661 Stiffness of right knee, not elsewhere classified: Secondary | ICD-10-CM | POA: Diagnosis not present

## 2019-05-02 DIAGNOSIS — M6281 Muscle weakness (generalized): Secondary | ICD-10-CM | POA: Diagnosis not present

## 2019-05-02 DIAGNOSIS — R262 Difficulty in walking, not elsewhere classified: Secondary | ICD-10-CM | POA: Diagnosis not present

## 2019-05-06 DIAGNOSIS — M25661 Stiffness of right knee, not elsewhere classified: Secondary | ICD-10-CM | POA: Diagnosis not present

## 2019-05-06 DIAGNOSIS — M25561 Pain in right knee: Secondary | ICD-10-CM | POA: Diagnosis not present

## 2019-05-06 DIAGNOSIS — M6281 Muscle weakness (generalized): Secondary | ICD-10-CM | POA: Diagnosis not present

## 2019-05-06 DIAGNOSIS — R262 Difficulty in walking, not elsewhere classified: Secondary | ICD-10-CM | POA: Diagnosis not present

## 2019-05-09 DIAGNOSIS — R262 Difficulty in walking, not elsewhere classified: Secondary | ICD-10-CM | POA: Diagnosis not present

## 2019-05-09 DIAGNOSIS — M6281 Muscle weakness (generalized): Secondary | ICD-10-CM | POA: Diagnosis not present

## 2019-05-09 DIAGNOSIS — M25661 Stiffness of right knee, not elsewhere classified: Secondary | ICD-10-CM | POA: Diagnosis not present

## 2019-05-09 DIAGNOSIS — M25561 Pain in right knee: Secondary | ICD-10-CM | POA: Diagnosis not present

## 2019-05-12 DIAGNOSIS — M6281 Muscle weakness (generalized): Secondary | ICD-10-CM | POA: Diagnosis not present

## 2019-05-12 DIAGNOSIS — M25661 Stiffness of right knee, not elsewhere classified: Secondary | ICD-10-CM | POA: Diagnosis not present

## 2019-05-12 DIAGNOSIS — R262 Difficulty in walking, not elsewhere classified: Secondary | ICD-10-CM | POA: Diagnosis not present

## 2019-05-12 DIAGNOSIS — M25561 Pain in right knee: Secondary | ICD-10-CM | POA: Diagnosis not present

## 2019-05-14 DIAGNOSIS — Z23 Encounter for immunization: Secondary | ICD-10-CM | POA: Diagnosis not present

## 2019-05-15 DIAGNOSIS — M25511 Pain in right shoulder: Secondary | ICD-10-CM | POA: Diagnosis not present

## 2019-05-15 DIAGNOSIS — J019 Acute sinusitis, unspecified: Secondary | ICD-10-CM | POA: Diagnosis not present

## 2019-05-15 DIAGNOSIS — H6502 Acute serous otitis media, left ear: Secondary | ICD-10-CM | POA: Diagnosis not present

## 2019-05-15 DIAGNOSIS — J060 Acute laryngopharyngitis: Secondary | ICD-10-CM | POA: Diagnosis not present

## 2019-05-15 DIAGNOSIS — J302 Other seasonal allergic rhinitis: Secondary | ICD-10-CM | POA: Diagnosis not present

## 2019-05-15 DIAGNOSIS — E782 Mixed hyperlipidemia: Secondary | ICD-10-CM | POA: Diagnosis not present

## 2019-05-15 DIAGNOSIS — I10 Essential (primary) hypertension: Secondary | ICD-10-CM | POA: Diagnosis not present

## 2019-05-15 DIAGNOSIS — H9209 Otalgia, unspecified ear: Secondary | ICD-10-CM | POA: Diagnosis not present

## 2019-05-15 DIAGNOSIS — K219 Gastro-esophageal reflux disease without esophagitis: Secondary | ICD-10-CM | POA: Diagnosis not present

## 2019-05-15 DIAGNOSIS — J309 Allergic rhinitis, unspecified: Secondary | ICD-10-CM | POA: Diagnosis not present

## 2019-05-15 DIAGNOSIS — G9009 Other idiopathic peripheral autonomic neuropathy: Secondary | ICD-10-CM | POA: Diagnosis not present

## 2019-05-20 DIAGNOSIS — E782 Mixed hyperlipidemia: Secondary | ICD-10-CM | POA: Diagnosis not present

## 2019-05-20 DIAGNOSIS — M4306 Spondylolysis, lumbar region: Secondary | ICD-10-CM | POA: Diagnosis not present

## 2019-05-20 DIAGNOSIS — M1711 Unilateral primary osteoarthritis, right knee: Secondary | ICD-10-CM | POA: Diagnosis not present

## 2019-05-20 DIAGNOSIS — I10 Essential (primary) hypertension: Secondary | ICD-10-CM | POA: Diagnosis not present

## 2019-05-20 DIAGNOSIS — J302 Other seasonal allergic rhinitis: Secondary | ICD-10-CM | POA: Diagnosis not present

## 2019-05-20 DIAGNOSIS — J309 Allergic rhinitis, unspecified: Secondary | ICD-10-CM | POA: Diagnosis not present

## 2019-05-20 DIAGNOSIS — G9009 Other idiopathic peripheral autonomic neuropathy: Secondary | ICD-10-CM | POA: Diagnosis not present

## 2019-05-20 DIAGNOSIS — M25512 Pain in left shoulder: Secondary | ICD-10-CM | POA: Diagnosis not present

## 2019-05-20 DIAGNOSIS — R7301 Impaired fasting glucose: Secondary | ICD-10-CM | POA: Diagnosis not present

## 2019-05-20 DIAGNOSIS — M4326 Fusion of spine, lumbar region: Secondary | ICD-10-CM | POA: Diagnosis not present

## 2019-05-20 DIAGNOSIS — M722 Plantar fascial fibromatosis: Secondary | ICD-10-CM | POA: Diagnosis not present

## 2019-05-20 DIAGNOSIS — M25511 Pain in right shoulder: Secondary | ICD-10-CM | POA: Diagnosis not present

## 2019-05-20 DIAGNOSIS — Z0001 Encounter for general adult medical examination with abnormal findings: Secondary | ICD-10-CM | POA: Diagnosis not present

## 2019-05-20 DIAGNOSIS — K219 Gastro-esophageal reflux disease without esophagitis: Secondary | ICD-10-CM | POA: Diagnosis not present

## 2019-05-27 DIAGNOSIS — M25562 Pain in left knee: Secondary | ICD-10-CM | POA: Diagnosis not present

## 2019-06-03 DIAGNOSIS — M25562 Pain in left knee: Secondary | ICD-10-CM | POA: Diagnosis not present

## 2019-06-05 ENCOUNTER — Other Ambulatory Visit: Payer: Self-pay

## 2019-06-05 ENCOUNTER — Ambulatory Visit: Payer: Self-pay

## 2019-06-05 MED ORDER — PEG 3350-KCL-NA BICARB-NACL 420 G PO SOLR: 4000.0000 mL | Freq: Once | ORAL | 0 refills | Status: DC

## 2019-06-05 MED ORDER — NA SULFATE-K SULFATE-MG SULF 17.5-3.13-1.6 GM/177ML PO SOLN: 1.0000 | Freq: Once | ORAL | 0 refills | Status: AC

## 2019-06-05 NOTE — Addendum Note (Signed)
Addended by: Noreene Larsson on: 06/05/2019 09:27 AM   Modules accepted: Orders

## 2019-06-05 NOTE — Addendum Note (Signed)
Addended by: Noreene Larsson on: 06/05/2019 10:13 AM   Modules accepted: Orders

## 2019-06-05 NOTE — Addendum Note (Signed)
Addended by: Noreene Larsson on: 06/05/2019 09:07 AM   Modules accepted: Orders

## 2019-06-05 NOTE — Progress Notes (Addendum)
Updated triage form and medication list.  Last triage was Nov 2020.  LSL, is it ok to proceed with scheduling procedure?

## 2019-06-05 NOTE — Patient Instructions (Addendum)
Lawrence White  05/18/1950 MRN: [id number]     Procedure Date: 12/31/2019 Time to register: 9:30 am Place to register: [facility] Procedure Time: 10:30 pm Scheduled provider: Dr. Gala Romney    PREPARATION FOR COLONOSCOPY WITH SUPREP BOWEL PREP KIT  Note: Suprep Bowel Prep Kit is a split-dose (2day) regimen. Consumption of BOTH 6-ounce bottles is required for a complete prep.  Please notify us immediately if you are diabetic, take iron supplements, or if you are on Coumadin or any other blood thinners.  Please hold the following medications: n/a                                                                                                                                                  2 DAYS BEFORE PROCEDURE:  DATE: 12/29/2019   DAY: Monday Begin clear liquid diet AFTER your lunch meal. NO SOLID FOODS after this point.  1 DAY BEFORE PROCEDURE:  DATE: 12/30/2019   DAY: Tuesday Continue clear liquids the entire day - NO SOLID FOOD.   Diabetic medications adjustments for today: n/a  At 6:00pm: Complete steps 1 through 4 below, using ONE (1) 6-ounce bottle, before going to bed. Step 1:  Pour ONE (1) 6-ounce bottle of SUPREP liquid into the mixing container.  Step 2:  Add cool drinking water to the 16 ounce line on the container and mix.  Note: Dilute the solution concentrate as directed prior to use. Step 3:  DRINK ALL the liquid in the container. Step 4:  You MUST drink an additional two (2) or more 16 ounce containers of water over the next one (1) hour.   Continue clear liquids.  DAY OF PROCEDURE:   DATE: 12/31/2019   DAY: Wednesday If you take medications for your heart, blood pressure, or breathing, you may take these medications.  Diabetic medications adjustments for today: n/a  5 hours before your procedure at 5:30 am: Step 1:  Pour ONE (1) 6-ounce bottle of SUPREP liquid into the mixing container.  Step 2:  Add cool drinking water to the 16 ounce  line on the container and mix.  Note: Dilute the solution concentrate as directed prior to use. Step 3:  DRINK ALL the liquid in the container. Step 4:  You MUST drink an additional two (2) or more 16 ounce containers of water over the next one (1) hour. You MUST complete the final glass of water at least 3 hours before your colonoscopy. Nothing by mouth past 7:30 am.  You may take your morning medications with sip of water unless we have instructed otherwise.    Please see below for Dietary Information.  CLEAR LIQUIDS INCLUDE:  Water Jello (NOT red in color)   Ice Popsicles (NOT red in color)   Tea (sugar ok, no milk/cream) Powdered fruit  flavored drinks  Coffee (sugar ok, no milk/cream) Gatorade/ Lemonade/ Kool-Aid  (NOT red in color)   Juice: apple, white grape, white cranberry Soft drinks  Clear bullion, consomme, broth (fat free beef/chicken/vegetable)  Carbonated beverages (any kind)  Strained chicken noodle soup Hard Candy   Remember: Clear liquids are liquids that will allow you to see your fingers on the other side of a clear glass. Be sure liquids are NOT red in color, and not cloudy, but CLEAR.  DO NOT EAT OR DRINK ANY OF THE FOLLOWING:  Dairy products of any kind   Cranberry juice Tomato juice / V8 juice   Grapefruit juice Orange juice     Red grape juice  Do not eat any solid foods, including such foods as: cereal, oatmeal, yogurt, fruits, vegetables, creamed soups, eggs, bread, crackers, pureed foods in a blender, etc.   HELPFUL HINTS FOR DRINKING PREP SOLUTION:   Make sure prep is extremely cold. Mix and refrigerate the the morning of the prep. You may also put in the freezer.   You may try mixing some Crystal Light or Country Time Lemonade if you prefer. Mix in small amounts; add more if necessary.  Try drinking through a straw  Rinse mouth with water or a mouthwash between glasses, to remove after-taste.  Try sipping on a cold beverage /ice/ popsicles between  glasses of prep.  Place a piece of sugar-free hard candy in mouth between glasses.  If you become nauseated, try consuming smaller amounts, or stretch out the time between glasses. Stop for 30-60 minutes, then slowly start back drinking.     OTHER INSTRUCTIONS  You will need a responsible adult at least 71 years of age to accompany you and drive you home. This person must remain in the waiting room during your procedure. The hospital will cancel your procedure if you do not have a responsible adult with you.   1. Wear loose fitting clothing that is easily removed. 2. Leave jewelry and other valuables at home.  3. Remove all body piercing jewelry and leave at home. 4. Total time from sign-in until discharge is approximately 2-3 hours. 5. You should go home directly after your procedure and rest. You can resume normal activities the day after your procedure. 6. The day of your procedure you should not:  Drive  Make legal decisions  Operate machinery  Drink alcohol  Return to work   You may call the office (Dept: [phone]) before 5:00pm, or page the doctor on call [phone]) after 5:00pm, for further instructions, if necessary.   Insurance Information YOU WILL NEED TO CHECK WITH YOUR INSURANCE COMPANY FOR THE BENEFITS OF COVERAGE YOU HAVE FOR THIS PROCEDURE.  UNFORTUNATELY, NOT ALL INSURANCE COMPANIES HAVE BENEFITS TO COVER ALL OR PART OF THESE TYPES OF PROCEDURES.  IT IS YOUR RESPONSIBILITY TO CHECK YOUR BENEFITS, HOWEVER, WE WILL BE GLAD TO ASSIST YOU WITH ANY CODES YOUR INSURANCE COMPANY MAY NEED.    PLEASE NOTE THAT MOST INSURANCE COMPANIES WILL NOT COVER A SCREENING COLONOSCOPY FOR PEOPLE UNDER THE AGE OF 50  IF YOU HAVE BCBS INSURANCE, YOU MAY HAVE BENEFITS FOR A SCREENING COLONOSCOPY BUT IF POLYPS ARE FOUND THE DIAGNOSIS WILL CHANGE AND THEN YOU MAY HAVE A DEDUCTIBLE THAT WILL NEED TO BE MET. SO PLEASE MAKE SURE YOU CHECK YOUR BENEFITS FOR A SCREENING COLONOSCOPY AS  WELL AS A DIAGNOSTIC COLONOSCOPY.

## 2019-06-13 DIAGNOSIS — M25562 Pain in left knee: Secondary | ICD-10-CM | POA: Diagnosis not present

## 2019-06-17 DIAGNOSIS — M1712 Unilateral primary osteoarthritis, left knee: Secondary | ICD-10-CM | POA: Diagnosis not present

## 2019-06-30 DIAGNOSIS — K219 Gastro-esophageal reflux disease without esophagitis: Secondary | ICD-10-CM | POA: Diagnosis not present

## 2019-06-30 DIAGNOSIS — I10 Essential (primary) hypertension: Secondary | ICD-10-CM | POA: Diagnosis not present

## 2019-06-30 DIAGNOSIS — E782 Mixed hyperlipidemia: Secondary | ICD-10-CM | POA: Diagnosis not present

## 2019-06-30 DIAGNOSIS — R7301 Impaired fasting glucose: Secondary | ICD-10-CM | POA: Diagnosis not present

## 2019-07-07 DIAGNOSIS — M1712 Unilateral primary osteoarthritis, left knee: Secondary | ICD-10-CM | POA: Diagnosis not present

## 2019-07-09 NOTE — Progress Notes (Signed)
Ok to proceed with TCS.

## 2019-07-14 DIAGNOSIS — M1712 Unilateral primary osteoarthritis, left knee: Secondary | ICD-10-CM | POA: Diagnosis not present

## 2019-07-22 ENCOUNTER — Telehealth: Payer: Self-pay | Admitting: *Deleted

## 2019-07-22 DIAGNOSIS — M1712 Unilateral primary osteoarthritis, left knee: Secondary | ICD-10-CM | POA: Diagnosis not present

## 2019-07-22 NOTE — Telephone Encounter (Signed)
Pt called in and requested to put procedure off until Fall due to moving.  Pt made aware that I will call him once procedure schedules have been released.  Endo notified.

## 2019-08-04 ENCOUNTER — Other Ambulatory Visit (HOSPITAL_COMMUNITY): Payer: Medicare Other

## 2019-08-06 ENCOUNTER — Ambulatory Visit (HOSPITAL_COMMUNITY): Admit: 2019-08-06 | Payer: Medicare Other | Admitting: Internal Medicine

## 2019-08-06 ENCOUNTER — Encounter (HOSPITAL_COMMUNITY): Payer: Self-pay

## 2019-08-06 SURGERY — COLONOSCOPY
Anesthesia: Moderate Sedation

## 2019-10-21 NOTE — Telephone Encounter (Signed)
Left message with wife for pt to call us back.

## 2019-11-04 NOTE — Telephone Encounter (Signed)
Left message for pt to call me back 

## 2019-11-05 NOTE — Telephone Encounter (Addendum)
Spoke to pt and he rescheduled his procedure to 12/31/2019 with arrival time at 9:30.  He rescheduled Covid screening to 12/29/2019 at 2:30.  Confirmed medical hx and meds with pt to make sure no changes since last triage.  He requested that I email the prep instructions because he is moving in 1 week and has no idea where he is moving to yet.  He sold his house.  Emailed with [employer].

## 2019-11-13 DIAGNOSIS — H9209 Otalgia, unspecified ear: Secondary | ICD-10-CM | POA: Diagnosis not present

## 2019-11-13 DIAGNOSIS — Z6828 Body mass index (BMI) 28.0-28.9, adult: Secondary | ICD-10-CM | POA: Diagnosis not present

## 2019-11-13 DIAGNOSIS — J302 Other seasonal allergic rhinitis: Secondary | ICD-10-CM | POA: Diagnosis not present

## 2019-11-13 DIAGNOSIS — R7301 Impaired fasting glucose: Secondary | ICD-10-CM | POA: Diagnosis not present

## 2019-11-13 DIAGNOSIS — K219 Gastro-esophageal reflux disease without esophagitis: Secondary | ICD-10-CM | POA: Diagnosis not present

## 2019-11-13 DIAGNOSIS — R2241 Localized swelling, mass and lump, right lower limb: Secondary | ICD-10-CM | POA: Diagnosis not present

## 2019-11-13 DIAGNOSIS — J060 Acute laryngopharyngitis: Secondary | ICD-10-CM | POA: Diagnosis not present

## 2019-11-13 DIAGNOSIS — Z6829 Body mass index (BMI) 29.0-29.9, adult: Secondary | ICD-10-CM | POA: Diagnosis not present

## 2019-11-13 DIAGNOSIS — M79604 Pain in right leg: Secondary | ICD-10-CM | POA: Diagnosis not present

## 2019-11-13 DIAGNOSIS — H6502 Acute serous otitis media, left ear: Secondary | ICD-10-CM | POA: Diagnosis not present

## 2019-11-13 DIAGNOSIS — E782 Mixed hyperlipidemia: Secondary | ICD-10-CM | POA: Diagnosis not present

## 2019-11-13 DIAGNOSIS — Z96651 Presence of right artificial knee joint: Secondary | ICD-10-CM | POA: Diagnosis not present

## 2019-11-19 DIAGNOSIS — I10 Essential (primary) hypertension: Secondary | ICD-10-CM | POA: Diagnosis not present

## 2019-11-19 DIAGNOSIS — M25511 Pain in right shoulder: Secondary | ICD-10-CM | POA: Diagnosis not present

## 2019-11-19 DIAGNOSIS — M25512 Pain in left shoulder: Secondary | ICD-10-CM | POA: Diagnosis not present

## 2019-11-19 DIAGNOSIS — R7301 Impaired fasting glucose: Secondary | ICD-10-CM | POA: Diagnosis not present

## 2019-11-19 DIAGNOSIS — J302 Other seasonal allergic rhinitis: Secondary | ICD-10-CM | POA: Diagnosis not present

## 2019-11-19 DIAGNOSIS — M1711 Unilateral primary osteoarthritis, right knee: Secondary | ICD-10-CM | POA: Diagnosis not present

## 2019-11-19 DIAGNOSIS — K219 Gastro-esophageal reflux disease without esophagitis: Secondary | ICD-10-CM | POA: Diagnosis not present

## 2019-11-19 DIAGNOSIS — M4306 Spondylolysis, lumbar region: Secondary | ICD-10-CM | POA: Diagnosis not present

## 2019-11-19 DIAGNOSIS — G9009 Other idiopathic peripheral autonomic neuropathy: Secondary | ICD-10-CM | POA: Diagnosis not present

## 2019-11-19 DIAGNOSIS — E782 Mixed hyperlipidemia: Secondary | ICD-10-CM | POA: Diagnosis not present

## 2019-11-19 DIAGNOSIS — M4326 Fusion of spine, lumbar region: Secondary | ICD-10-CM | POA: Diagnosis not present

## 2019-12-02 ENCOUNTER — Other Ambulatory Visit: Payer: Self-pay | Admitting: Internal Medicine

## 2019-12-02 DIAGNOSIS — M545 Low back pain, unspecified: Secondary | ICD-10-CM

## 2019-12-03 ENCOUNTER — Telehealth: Payer: Self-pay | Admitting: *Deleted

## 2019-12-03 NOTE — Telephone Encounter (Signed)
Pt called me and requested to cancel his procedure and Covid screening.  He sold his house and thought he would be settled in a new house by now but it hasn't happened.  He requested to cancel altogether for now until he gets things straightened out.  Called and lmom for [place] in Endo.

## 2019-12-29 ENCOUNTER — Other Ambulatory Visit (HOSPITAL_COMMUNITY): Payer: Medicare Other

## 2019-12-31 ENCOUNTER — Encounter (HOSPITAL_COMMUNITY): Payer: Self-pay

## 2019-12-31 ENCOUNTER — Ambulatory Visit (HOSPITAL_COMMUNITY): Admit: 2019-12-31 | Payer: Medicare Other | Admitting: Internal Medicine

## 2019-12-31 SURGERY — COLONOSCOPY
Anesthesia: Moderate Sedation

## 2020-01-02 DIAGNOSIS — H25013 Cortical age-related cataract, bilateral: Secondary | ICD-10-CM | POA: Diagnosis not present

## 2020-01-02 DIAGNOSIS — H43811 Vitreous degeneration, right eye: Secondary | ICD-10-CM | POA: Diagnosis not present

## 2020-01-02 DIAGNOSIS — H524 Presbyopia: Secondary | ICD-10-CM | POA: Diagnosis not present

## 2020-01-02 DIAGNOSIS — H2513 Age-related nuclear cataract, bilateral: Secondary | ICD-10-CM | POA: Diagnosis not present

## 2020-01-25 ENCOUNTER — Other Ambulatory Visit: Payer: Self-pay

## 2020-01-25 ENCOUNTER — Ambulatory Visit
Admission: RE | Admit: 2020-01-25 | Discharge: 2020-01-25 | Disposition: A | Discharge status: Home / Self Care | Payer: Medicare Other | Source: Ambulatory Visit | Attending: Internal Medicine | Admitting: Internal Medicine

## 2020-01-25 DIAGNOSIS — M8938 Hypertrophy of bone, other site: Secondary | ICD-10-CM | POA: Diagnosis not present

## 2020-01-25 DIAGNOSIS — M545 Low back pain, unspecified: Secondary | ICD-10-CM

## 2020-01-25 DIAGNOSIS — M5126 Other intervertebral disc displacement, lumbar region: Secondary | ICD-10-CM | POA: Diagnosis not present

## 2020-01-25 DIAGNOSIS — M4316 Spondylolisthesis, lumbar region: Secondary | ICD-10-CM | POA: Diagnosis not present

## 2020-01-25 DIAGNOSIS — M48061 Spinal stenosis, lumbar region without neurogenic claudication: Secondary | ICD-10-CM | POA: Diagnosis not present

## 2020-01-25 MED ORDER — GADOBENATE DIMEGLUMINE 529 MG/ML IV SOLN
20.0000 mL | Freq: Once | INTRAVENOUS | Status: AC | PRN
  Administered 2020-01-25: 20 mL via INTRAVENOUS

## 2020-01-26 DIAGNOSIS — M1712 Unilateral primary osteoarthritis, left knee: Secondary | ICD-10-CM | POA: Diagnosis not present

## 2020-02-02 DIAGNOSIS — M1712 Unilateral primary osteoarthritis, left knee: Secondary | ICD-10-CM | POA: Diagnosis not present

## 2020-02-09 ENCOUNTER — Other Ambulatory Visit: Payer: Self-pay

## 2020-02-09 ENCOUNTER — Encounter: Payer: Self-pay | Admitting: Podiatry

## 2020-02-09 ENCOUNTER — Ambulatory Visit (INDEPENDENT_AMBULATORY_CARE_PROVIDER_SITE_OTHER): Payer: Medicare Other | Admitting: Podiatry

## 2020-02-09 DIAGNOSIS — M722 Plantar fascial fibromatosis: Secondary | ICD-10-CM

## 2020-02-09 DIAGNOSIS — M1712 Unilateral primary osteoarthritis, left knee: Secondary | ICD-10-CM | POA: Diagnosis not present

## 2020-02-09 MED ORDER — TRIAMCINOLONE ACETONIDE 40 MG/ML IJ SUSP
20.0000 mg | Freq: Once | INTRAMUSCULAR | Status: AC
  Administered 2020-02-09: 10:00:00 20 mg

## 2020-02-09 NOTE — Progress Notes (Signed)
He presents today chief complaint of heel pain left x1 month.  States that this heel started to bother me again it has been over a year since it was bothering me.  I think is just time for shot.  Objective: Vital signs are stable alert oriented x3 pain on palpation medial calcaneal tubercle of the left heel.  Pulses remain palpable no open lesions or wounds.  Assessment: Plantar fasciitis.  Plan: Reinjected the heel today 20 mg Kenalog 5 mg Marcaine point of maximal tenderness.  Follow-up as needed

## 2020-02-18 ENCOUNTER — Encounter: Payer: Self-pay | Admitting: Podiatry

## 2020-02-18 ENCOUNTER — Other Ambulatory Visit: Payer: Self-pay

## 2020-02-18 ENCOUNTER — Ambulatory Visit (INDEPENDENT_AMBULATORY_CARE_PROVIDER_SITE_OTHER): Payer: Medicare Other | Admitting: Podiatry

## 2020-02-18 DIAGNOSIS — M722 Plantar fascial fibromatosis: Secondary | ICD-10-CM | POA: Diagnosis not present

## 2020-02-18 MED ORDER — TRIAMCINOLONE ACETONIDE 40 MG/ML IJ SUSP
20.0000 mg | Freq: Once | INTRAMUSCULAR | Status: AC
  Administered 2020-02-18: 11:00:00 20 mg

## 2020-02-18 MED ORDER — METHYLPREDNISOLONE 4 MG PO TBPK: ORAL_TABLET | ORAL | 0 refills | Status: DC

## 2020-02-18 NOTE — Progress Notes (Signed)
He presents today for follow-up of his plantar fasciitis left.  He states that it might be about 10% better but have been moving into a new house so that has really aggravated it some as well.  Objective: Vital signs are stable alert oriented x3.  There is no erythema edema cellulitis drainage or odor.  He has significant pain on palpation medial calcaneal tubercle of the left heel.  Assessment: Chronic proximal plantar fasciitis left.  Plan: I reinjected it today start him on methylprednisolone I will follow-up with Lawrence White in about 6 weeks

## 2020-02-19 DIAGNOSIS — G9009 Other idiopathic peripheral autonomic neuropathy: Secondary | ICD-10-CM | POA: Diagnosis not present

## 2020-02-19 DIAGNOSIS — M1711 Unilateral primary osteoarthritis, right knee: Secondary | ICD-10-CM | POA: Diagnosis not present

## 2020-02-19 DIAGNOSIS — I10 Essential (primary) hypertension: Secondary | ICD-10-CM | POA: Diagnosis not present

## 2020-02-19 DIAGNOSIS — M4326 Fusion of spine, lumbar region: Secondary | ICD-10-CM | POA: Diagnosis not present

## 2020-02-19 DIAGNOSIS — R7301 Impaired fasting glucose: Secondary | ICD-10-CM | POA: Diagnosis not present

## 2020-02-19 DIAGNOSIS — M4306 Spondylolysis, lumbar region: Secondary | ICD-10-CM | POA: Diagnosis not present

## 2020-02-19 DIAGNOSIS — M25511 Pain in right shoulder: Secondary | ICD-10-CM | POA: Diagnosis not present

## 2020-02-19 DIAGNOSIS — E782 Mixed hyperlipidemia: Secondary | ICD-10-CM | POA: Diagnosis not present

## 2020-02-19 DIAGNOSIS — J302 Other seasonal allergic rhinitis: Secondary | ICD-10-CM | POA: Diagnosis not present

## 2020-02-19 DIAGNOSIS — K219 Gastro-esophageal reflux disease without esophagitis: Secondary | ICD-10-CM | POA: Diagnosis not present

## 2020-02-19 DIAGNOSIS — M25512 Pain in left shoulder: Secondary | ICD-10-CM | POA: Diagnosis not present

## 2020-02-20 DIAGNOSIS — R7301 Impaired fasting glucose: Secondary | ICD-10-CM | POA: Diagnosis not present

## 2020-02-20 DIAGNOSIS — I10 Essential (primary) hypertension: Secondary | ICD-10-CM | POA: Diagnosis not present

## 2020-02-20 DIAGNOSIS — K219 Gastro-esophageal reflux disease without esophagitis: Secondary | ICD-10-CM | POA: Diagnosis not present

## 2020-02-20 DIAGNOSIS — E782 Mixed hyperlipidemia: Secondary | ICD-10-CM | POA: Diagnosis not present

## 2020-03-02 ENCOUNTER — Ambulatory Visit: Payer: Medicare Other | Admitting: Podiatry

## 2020-03-26 DIAGNOSIS — I10 Essential (primary) hypertension: Secondary | ICD-10-CM | POA: Diagnosis not present

## 2020-03-26 DIAGNOSIS — M4800 Spinal stenosis, site unspecified: Secondary | ICD-10-CM | POA: Diagnosis not present

## 2020-03-31 ENCOUNTER — Encounter: Payer: Medicare Other | Admitting: Podiatry

## 2020-04-29 DIAGNOSIS — M5416 Radiculopathy, lumbar region: Secondary | ICD-10-CM | POA: Diagnosis not present

## 2020-04-29 DIAGNOSIS — M5116 Intervertebral disc disorders with radiculopathy, lumbar region: Secondary | ICD-10-CM | POA: Diagnosis not present

## 2020-05-19 DIAGNOSIS — G9009 Other idiopathic peripheral autonomic neuropathy: Secondary | ICD-10-CM | POA: Diagnosis not present

## 2020-05-19 DIAGNOSIS — M25511 Pain in right shoulder: Secondary | ICD-10-CM | POA: Diagnosis not present

## 2020-05-19 DIAGNOSIS — K219 Gastro-esophageal reflux disease without esophagitis: Secondary | ICD-10-CM | POA: Diagnosis not present

## 2020-05-19 DIAGNOSIS — E782 Mixed hyperlipidemia: Secondary | ICD-10-CM | POA: Diagnosis not present

## 2020-05-19 DIAGNOSIS — R7301 Impaired fasting glucose: Secondary | ICD-10-CM | POA: Diagnosis not present

## 2020-05-19 DIAGNOSIS — M1711 Unilateral primary osteoarthritis, right knee: Secondary | ICD-10-CM | POA: Diagnosis not present

## 2020-05-19 DIAGNOSIS — I10 Essential (primary) hypertension: Secondary | ICD-10-CM | POA: Diagnosis not present

## 2020-05-19 DIAGNOSIS — M25512 Pain in left shoulder: Secondary | ICD-10-CM | POA: Diagnosis not present

## 2020-05-19 DIAGNOSIS — J302 Other seasonal allergic rhinitis: Secondary | ICD-10-CM | POA: Diagnosis not present

## 2020-05-19 DIAGNOSIS — M4326 Fusion of spine, lumbar region: Secondary | ICD-10-CM | POA: Diagnosis not present

## 2020-05-19 DIAGNOSIS — M4306 Spondylolysis, lumbar region: Secondary | ICD-10-CM | POA: Diagnosis not present

## 2020-05-24 DIAGNOSIS — G9009 Other idiopathic peripheral autonomic neuropathy: Secondary | ICD-10-CM | POA: Diagnosis not present

## 2020-05-24 DIAGNOSIS — R7301 Impaired fasting glucose: Secondary | ICD-10-CM | POA: Diagnosis not present

## 2020-05-24 DIAGNOSIS — Z6828 Body mass index (BMI) 28.0-28.9, adult: Secondary | ICD-10-CM | POA: Diagnosis not present

## 2020-05-24 DIAGNOSIS — E782 Mixed hyperlipidemia: Secondary | ICD-10-CM | POA: Diagnosis not present

## 2020-05-24 DIAGNOSIS — R945 Abnormal results of liver function studies: Secondary | ICD-10-CM | POA: Diagnosis not present

## 2020-05-24 DIAGNOSIS — I10 Essential (primary) hypertension: Secondary | ICD-10-CM | POA: Diagnosis not present

## 2020-05-24 DIAGNOSIS — M722 Plantar fascial fibromatosis: Secondary | ICD-10-CM | POA: Diagnosis not present

## 2020-05-24 DIAGNOSIS — M1990 Unspecified osteoarthritis, unspecified site: Secondary | ICD-10-CM | POA: Diagnosis not present

## 2020-05-27 DIAGNOSIS — M25511 Pain in right shoulder: Secondary | ICD-10-CM | POA: Diagnosis not present

## 2020-05-27 DIAGNOSIS — E782 Mixed hyperlipidemia: Secondary | ICD-10-CM | POA: Diagnosis not present

## 2020-05-27 DIAGNOSIS — R7301 Impaired fasting glucose: Secondary | ICD-10-CM | POA: Diagnosis not present

## 2020-05-27 DIAGNOSIS — I10 Essential (primary) hypertension: Secondary | ICD-10-CM | POA: Diagnosis not present

## 2020-05-27 DIAGNOSIS — K219 Gastro-esophageal reflux disease without esophagitis: Secondary | ICD-10-CM | POA: Diagnosis not present

## 2020-05-27 DIAGNOSIS — M4306 Spondylolysis, lumbar region: Secondary | ICD-10-CM | POA: Diagnosis not present

## 2020-05-27 DIAGNOSIS — J302 Other seasonal allergic rhinitis: Secondary | ICD-10-CM | POA: Diagnosis not present

## 2020-05-27 DIAGNOSIS — M4326 Fusion of spine, lumbar region: Secondary | ICD-10-CM | POA: Diagnosis not present

## 2020-05-27 DIAGNOSIS — G9009 Other idiopathic peripheral autonomic neuropathy: Secondary | ICD-10-CM | POA: Diagnosis not present

## 2020-05-27 DIAGNOSIS — M25512 Pain in left shoulder: Secondary | ICD-10-CM | POA: Diagnosis not present

## 2020-05-27 DIAGNOSIS — M1711 Unilateral primary osteoarthritis, right knee: Secondary | ICD-10-CM | POA: Diagnosis not present

## 2020-06-08 DIAGNOSIS — M1611 Unilateral primary osteoarthritis, right hip: Secondary | ICD-10-CM | POA: Diagnosis not present

## 2020-06-11 DIAGNOSIS — M25551 Pain in right hip: Secondary | ICD-10-CM | POA: Diagnosis not present

## 2020-06-14 DIAGNOSIS — M1611 Unilateral primary osteoarthritis, right hip: Secondary | ICD-10-CM | POA: Diagnosis not present

## 2020-06-15 DIAGNOSIS — M25551 Pain in right hip: Secondary | ICD-10-CM | POA: Diagnosis not present

## 2020-06-25 DIAGNOSIS — M25551 Pain in right hip: Secondary | ICD-10-CM | POA: Diagnosis not present

## 2020-07-07 DIAGNOSIS — U071 COVID-19: Secondary | ICD-10-CM | POA: Diagnosis not present

## 2020-07-16 DIAGNOSIS — Z23 Encounter for immunization: Secondary | ICD-10-CM | POA: Diagnosis not present

## 2020-07-27 DIAGNOSIS — M1990 Unspecified osteoarthritis, unspecified site: Secondary | ICD-10-CM | POA: Diagnosis not present

## 2020-07-27 DIAGNOSIS — I10 Essential (primary) hypertension: Secondary | ICD-10-CM | POA: Diagnosis not present

## 2020-08-12 DIAGNOSIS — R55 Syncope and collapse: Secondary | ICD-10-CM | POA: Diagnosis not present

## 2020-08-12 DIAGNOSIS — I10 Essential (primary) hypertension: Secondary | ICD-10-CM | POA: Diagnosis not present

## 2020-08-12 DIAGNOSIS — G3184 Mild cognitive impairment, so stated: Secondary | ICD-10-CM | POA: Diagnosis not present

## 2020-08-12 DIAGNOSIS — M159 Polyosteoarthritis, unspecified: Secondary | ICD-10-CM | POA: Diagnosis not present

## 2020-08-12 DIAGNOSIS — R519 Headache, unspecified: Secondary | ICD-10-CM | POA: Diagnosis not present

## 2020-08-13 ENCOUNTER — Other Ambulatory Visit (HOSPITAL_COMMUNITY): Payer: Self-pay | Admitting: Internal Medicine

## 2020-08-13 DIAGNOSIS — G3184 Mild cognitive impairment, so stated: Secondary | ICD-10-CM

## 2020-08-19 ENCOUNTER — Other Ambulatory Visit: Payer: Self-pay | Admitting: Internal Medicine

## 2020-08-19 ENCOUNTER — Other Ambulatory Visit (HOSPITAL_COMMUNITY): Payer: Self-pay | Admitting: Internal Medicine

## 2020-08-19 DIAGNOSIS — G3184 Mild cognitive impairment, so stated: Secondary | ICD-10-CM

## 2020-08-25 DIAGNOSIS — Z01812 Encounter for preprocedural laboratory examination: Secondary | ICD-10-CM | POA: Diagnosis not present

## 2020-08-25 DIAGNOSIS — R791 Abnormal coagulation profile: Secondary | ICD-10-CM | POA: Diagnosis not present

## 2020-08-25 DIAGNOSIS — M1611 Unilateral primary osteoarthritis, right hip: Secondary | ICD-10-CM | POA: Diagnosis not present

## 2020-08-25 DIAGNOSIS — M25551 Pain in right hip: Secondary | ICD-10-CM | POA: Diagnosis not present

## 2020-09-10 DIAGNOSIS — R2689 Other abnormalities of gait and mobility: Secondary | ICD-10-CM | POA: Diagnosis not present

## 2020-09-10 DIAGNOSIS — R531 Weakness: Secondary | ICD-10-CM | POA: Diagnosis not present

## 2020-09-10 DIAGNOSIS — M25551 Pain in right hip: Secondary | ICD-10-CM | POA: Diagnosis not present

## 2020-09-16 DIAGNOSIS — M1611 Unilateral primary osteoarthritis, right hip: Secondary | ICD-10-CM | POA: Diagnosis not present

## 2020-09-21 DIAGNOSIS — R262 Difficulty in walking, not elsewhere classified: Secondary | ICD-10-CM | POA: Diagnosis not present

## 2020-09-21 DIAGNOSIS — M25651 Stiffness of right hip, not elsewhere classified: Secondary | ICD-10-CM | POA: Diagnosis not present

## 2020-09-21 DIAGNOSIS — M1611 Unilateral primary osteoarthritis, right hip: Secondary | ICD-10-CM | POA: Diagnosis not present

## 2020-09-21 DIAGNOSIS — M6281 Muscle weakness (generalized): Secondary | ICD-10-CM | POA: Diagnosis not present

## 2020-09-23 DIAGNOSIS — M25651 Stiffness of right hip, not elsewhere classified: Secondary | ICD-10-CM | POA: Diagnosis not present

## 2020-09-23 DIAGNOSIS — M6281 Muscle weakness (generalized): Secondary | ICD-10-CM | POA: Diagnosis not present

## 2020-09-23 DIAGNOSIS — M1611 Unilateral primary osteoarthritis, right hip: Secondary | ICD-10-CM | POA: Diagnosis not present

## 2020-09-23 DIAGNOSIS — R262 Difficulty in walking, not elsewhere classified: Secondary | ICD-10-CM | POA: Diagnosis not present

## 2020-09-27 DIAGNOSIS — M1611 Unilateral primary osteoarthritis, right hip: Secondary | ICD-10-CM | POA: Diagnosis not present

## 2020-09-28 DIAGNOSIS — R262 Difficulty in walking, not elsewhere classified: Secondary | ICD-10-CM | POA: Diagnosis not present

## 2020-09-28 DIAGNOSIS — M1611 Unilateral primary osteoarthritis, right hip: Secondary | ICD-10-CM | POA: Diagnosis not present

## 2020-09-28 DIAGNOSIS — M25651 Stiffness of right hip, not elsewhere classified: Secondary | ICD-10-CM | POA: Diagnosis not present

## 2020-09-28 DIAGNOSIS — M6281 Muscle weakness (generalized): Secondary | ICD-10-CM | POA: Diagnosis not present

## 2020-09-30 DIAGNOSIS — M6281 Muscle weakness (generalized): Secondary | ICD-10-CM | POA: Diagnosis not present

## 2020-09-30 DIAGNOSIS — M25651 Stiffness of right hip, not elsewhere classified: Secondary | ICD-10-CM | POA: Diagnosis not present

## 2020-09-30 DIAGNOSIS — R262 Difficulty in walking, not elsewhere classified: Secondary | ICD-10-CM | POA: Diagnosis not present

## 2020-09-30 DIAGNOSIS — M1611 Unilateral primary osteoarthritis, right hip: Secondary | ICD-10-CM | POA: Diagnosis not present

## 2020-10-05 DIAGNOSIS — M1611 Unilateral primary osteoarthritis, right hip: Secondary | ICD-10-CM | POA: Diagnosis not present

## 2020-10-05 DIAGNOSIS — M6281 Muscle weakness (generalized): Secondary | ICD-10-CM | POA: Diagnosis not present

## 2020-10-05 DIAGNOSIS — R262 Difficulty in walking, not elsewhere classified: Secondary | ICD-10-CM | POA: Diagnosis not present

## 2020-10-05 DIAGNOSIS — M25651 Stiffness of right hip, not elsewhere classified: Secondary | ICD-10-CM | POA: Diagnosis not present

## 2020-10-07 DIAGNOSIS — M1611 Unilateral primary osteoarthritis, right hip: Secondary | ICD-10-CM | POA: Diagnosis not present

## 2020-10-07 DIAGNOSIS — R262 Difficulty in walking, not elsewhere classified: Secondary | ICD-10-CM | POA: Diagnosis not present

## 2020-10-07 DIAGNOSIS — M25651 Stiffness of right hip, not elsewhere classified: Secondary | ICD-10-CM | POA: Diagnosis not present

## 2020-10-07 DIAGNOSIS — M6281 Muscle weakness (generalized): Secondary | ICD-10-CM | POA: Diagnosis not present

## 2020-10-14 DIAGNOSIS — R262 Difficulty in walking, not elsewhere classified: Secondary | ICD-10-CM | POA: Diagnosis not present

## 2020-10-14 DIAGNOSIS — M6281 Muscle weakness (generalized): Secondary | ICD-10-CM | POA: Diagnosis not present

## 2020-10-14 DIAGNOSIS — M1611 Unilateral primary osteoarthritis, right hip: Secondary | ICD-10-CM | POA: Diagnosis not present

## 2020-10-14 DIAGNOSIS — M25651 Stiffness of right hip, not elsewhere classified: Secondary | ICD-10-CM | POA: Diagnosis not present

## 2020-10-21 DIAGNOSIS — M25651 Stiffness of right hip, not elsewhere classified: Secondary | ICD-10-CM | POA: Diagnosis not present

## 2020-10-21 DIAGNOSIS — R262 Difficulty in walking, not elsewhere classified: Secondary | ICD-10-CM | POA: Diagnosis not present

## 2020-10-21 DIAGNOSIS — M1611 Unilateral primary osteoarthritis, right hip: Secondary | ICD-10-CM | POA: Diagnosis not present

## 2020-10-21 DIAGNOSIS — M6281 Muscle weakness (generalized): Secondary | ICD-10-CM | POA: Diagnosis not present

## 2020-10-27 DIAGNOSIS — M1611 Unilateral primary osteoarthritis, right hip: Secondary | ICD-10-CM | POA: Diagnosis not present

## 2020-11-08 DIAGNOSIS — M1612 Unilateral primary osteoarthritis, left hip: Secondary | ICD-10-CM | POA: Diagnosis not present

## 2020-11-08 DIAGNOSIS — M1712 Unilateral primary osteoarthritis, left knee: Secondary | ICD-10-CM | POA: Diagnosis not present

## 2020-11-19 DIAGNOSIS — I10 Essential (primary) hypertension: Secondary | ICD-10-CM | POA: Diagnosis not present

## 2020-11-19 DIAGNOSIS — E1165 Type 2 diabetes mellitus with hyperglycemia: Secondary | ICD-10-CM | POA: Diagnosis not present

## 2020-11-24 DIAGNOSIS — M1712 Unilateral primary osteoarthritis, left knee: Secondary | ICD-10-CM | POA: Diagnosis not present

## 2020-11-29 DIAGNOSIS — M5116 Intervertebral disc disorders with radiculopathy, lumbar region: Secondary | ICD-10-CM | POA: Diagnosis not present

## 2020-11-29 DIAGNOSIS — M5416 Radiculopathy, lumbar region: Secondary | ICD-10-CM | POA: Diagnosis not present

## 2020-11-30 DIAGNOSIS — J302 Other seasonal allergic rhinitis: Secondary | ICD-10-CM | POA: Insufficient documentation

## 2020-11-30 DIAGNOSIS — I10 Essential (primary) hypertension: Secondary | ICD-10-CM | POA: Insufficient documentation

## 2020-11-30 DIAGNOSIS — E782 Mixed hyperlipidemia: Secondary | ICD-10-CM | POA: Insufficient documentation

## 2020-11-30 DIAGNOSIS — R7301 Impaired fasting glucose: Secondary | ICD-10-CM | POA: Diagnosis not present

## 2020-12-01 DIAGNOSIS — M1712 Unilateral primary osteoarthritis, left knee: Secondary | ICD-10-CM | POA: Diagnosis not present

## 2020-12-02 DIAGNOSIS — Z96651 Presence of right artificial knee joint: Secondary | ICD-10-CM | POA: Diagnosis not present

## 2020-12-02 DIAGNOSIS — M159 Polyosteoarthritis, unspecified: Secondary | ICD-10-CM | POA: Insufficient documentation

## 2020-12-02 DIAGNOSIS — Z0001 Encounter for general adult medical examination with abnormal findings: Secondary | ICD-10-CM | POA: Diagnosis not present

## 2020-12-02 DIAGNOSIS — K219 Gastro-esophageal reflux disease without esophagitis: Secondary | ICD-10-CM | POA: Insufficient documentation

## 2020-12-02 DIAGNOSIS — M1712 Unilateral primary osteoarthritis, left knee: Secondary | ICD-10-CM | POA: Diagnosis not present

## 2020-12-02 DIAGNOSIS — M25775 Osteophyte, left foot: Secondary | ICD-10-CM | POA: Diagnosis not present

## 2020-12-02 DIAGNOSIS — G629 Polyneuropathy, unspecified: Secondary | ICD-10-CM | POA: Insufficient documentation

## 2020-12-02 DIAGNOSIS — M47896 Other spondylosis, lumbar region: Secondary | ICD-10-CM | POA: Diagnosis not present

## 2020-12-02 DIAGNOSIS — E782 Mixed hyperlipidemia: Secondary | ICD-10-CM | POA: Diagnosis not present

## 2020-12-02 DIAGNOSIS — E559 Vitamin D deficiency, unspecified: Secondary | ICD-10-CM | POA: Insufficient documentation

## 2020-12-02 DIAGNOSIS — Z23 Encounter for immunization: Secondary | ICD-10-CM | POA: Diagnosis not present

## 2020-12-02 DIAGNOSIS — M25511 Pain in right shoulder: Secondary | ICD-10-CM | POA: Diagnosis not present

## 2020-12-02 DIAGNOSIS — R7301 Impaired fasting glucose: Secondary | ICD-10-CM | POA: Diagnosis not present

## 2020-12-02 DIAGNOSIS — M7752 Other enthesopathy of left foot: Secondary | ICD-10-CM | POA: Insufficient documentation

## 2020-12-02 DIAGNOSIS — M1990 Unspecified osteoarthritis, unspecified site: Secondary | ICD-10-CM | POA: Diagnosis not present

## 2020-12-02 DIAGNOSIS — I10 Essential (primary) hypertension: Secondary | ICD-10-CM | POA: Diagnosis not present

## 2021-01-07 DIAGNOSIS — H25013 Cortical age-related cataract, bilateral: Secondary | ICD-10-CM | POA: Diagnosis not present

## 2021-01-07 DIAGNOSIS — H524 Presbyopia: Secondary | ICD-10-CM | POA: Diagnosis not present

## 2021-01-07 DIAGNOSIS — H2513 Age-related nuclear cataract, bilateral: Secondary | ICD-10-CM | POA: Diagnosis not present

## 2021-01-07 DIAGNOSIS — H43811 Vitreous degeneration, right eye: Secondary | ICD-10-CM | POA: Diagnosis not present

## 2021-02-16 DIAGNOSIS — R791 Abnormal coagulation profile: Secondary | ICD-10-CM | POA: Diagnosis not present

## 2021-02-16 DIAGNOSIS — Z01812 Encounter for preprocedural laboratory examination: Secondary | ICD-10-CM | POA: Diagnosis not present

## 2021-02-16 DIAGNOSIS — M1612 Unilateral primary osteoarthritis, left hip: Secondary | ICD-10-CM | POA: Diagnosis not present

## 2021-02-16 DIAGNOSIS — M25552 Pain in left hip: Secondary | ICD-10-CM | POA: Diagnosis not present

## 2021-02-24 DIAGNOSIS — Z96641 Presence of right artificial hip joint: Secondary | ICD-10-CM | POA: Diagnosis not present

## 2021-02-24 DIAGNOSIS — R7301 Impaired fasting glucose: Secondary | ICD-10-CM | POA: Diagnosis not present

## 2021-02-24 DIAGNOSIS — M25511 Pain in right shoulder: Secondary | ICD-10-CM | POA: Diagnosis not present

## 2021-02-24 DIAGNOSIS — J302 Other seasonal allergic rhinitis: Secondary | ICD-10-CM | POA: Diagnosis not present

## 2021-02-24 DIAGNOSIS — M1712 Unilateral primary osteoarthritis, left knee: Secondary | ICD-10-CM | POA: Diagnosis not present

## 2021-02-24 DIAGNOSIS — M47896 Other spondylosis, lumbar region: Secondary | ICD-10-CM | POA: Diagnosis not present

## 2021-02-24 DIAGNOSIS — Z96651 Presence of right artificial knee joint: Secondary | ICD-10-CM | POA: Diagnosis not present

## 2021-02-24 DIAGNOSIS — M1990 Unspecified osteoarthritis, unspecified site: Secondary | ICD-10-CM | POA: Diagnosis not present

## 2021-02-24 DIAGNOSIS — G629 Polyneuropathy, unspecified: Secondary | ICD-10-CM | POA: Diagnosis not present

## 2021-02-24 DIAGNOSIS — M25775 Osteophyte, left foot: Secondary | ICD-10-CM | POA: Diagnosis not present

## 2021-02-24 DIAGNOSIS — I10 Essential (primary) hypertension: Secondary | ICD-10-CM | POA: Diagnosis not present

## 2021-02-24 DIAGNOSIS — Z01818 Encounter for other preprocedural examination: Secondary | ICD-10-CM | POA: Diagnosis not present

## 2021-03-24 DIAGNOSIS — M1612 Unilateral primary osteoarthritis, left hip: Secondary | ICD-10-CM | POA: Diagnosis not present

## 2021-03-28 DIAGNOSIS — M25552 Pain in left hip: Secondary | ICD-10-CM | POA: Diagnosis not present

## 2021-03-28 DIAGNOSIS — M1612 Unilateral primary osteoarthritis, left hip: Secondary | ICD-10-CM | POA: Diagnosis not present

## 2021-03-28 DIAGNOSIS — M6281 Muscle weakness (generalized): Secondary | ICD-10-CM | POA: Diagnosis not present

## 2021-03-28 DIAGNOSIS — R262 Difficulty in walking, not elsewhere classified: Secondary | ICD-10-CM | POA: Diagnosis not present

## 2021-03-28 DIAGNOSIS — M25652 Stiffness of left hip, not elsewhere classified: Secondary | ICD-10-CM | POA: Diagnosis not present

## 2021-03-28 DIAGNOSIS — Z96642 Presence of left artificial hip joint: Secondary | ICD-10-CM | POA: Diagnosis not present

## 2021-04-06 DIAGNOSIS — M1612 Unilateral primary osteoarthritis, left hip: Secondary | ICD-10-CM | POA: Diagnosis not present

## 2021-04-08 DIAGNOSIS — Z96642 Presence of left artificial hip joint: Secondary | ICD-10-CM | POA: Diagnosis not present

## 2021-04-08 DIAGNOSIS — M25552 Pain in left hip: Secondary | ICD-10-CM | POA: Diagnosis not present

## 2021-04-08 DIAGNOSIS — M6281 Muscle weakness (generalized): Secondary | ICD-10-CM | POA: Diagnosis not present

## 2021-04-08 DIAGNOSIS — M25652 Stiffness of left hip, not elsewhere classified: Secondary | ICD-10-CM | POA: Diagnosis not present

## 2021-04-08 DIAGNOSIS — R262 Difficulty in walking, not elsewhere classified: Secondary | ICD-10-CM | POA: Diagnosis not present

## 2021-04-08 DIAGNOSIS — M1612 Unilateral primary osteoarthritis, left hip: Secondary | ICD-10-CM | POA: Diagnosis not present

## 2021-04-15 DIAGNOSIS — M25652 Stiffness of left hip, not elsewhere classified: Secondary | ICD-10-CM | POA: Diagnosis not present

## 2021-04-15 DIAGNOSIS — Z96642 Presence of left artificial hip joint: Secondary | ICD-10-CM | POA: Diagnosis not present

## 2021-04-15 DIAGNOSIS — R262 Difficulty in walking, not elsewhere classified: Secondary | ICD-10-CM | POA: Diagnosis not present

## 2021-04-15 DIAGNOSIS — M1612 Unilateral primary osteoarthritis, left hip: Secondary | ICD-10-CM | POA: Diagnosis not present

## 2021-04-15 DIAGNOSIS — M6281 Muscle weakness (generalized): Secondary | ICD-10-CM | POA: Diagnosis not present

## 2021-04-15 DIAGNOSIS — M25552 Pain in left hip: Secondary | ICD-10-CM | POA: Diagnosis not present

## 2021-04-21 DIAGNOSIS — R262 Difficulty in walking, not elsewhere classified: Secondary | ICD-10-CM | POA: Diagnosis not present

## 2021-04-21 DIAGNOSIS — M6281 Muscle weakness (generalized): Secondary | ICD-10-CM | POA: Diagnosis not present

## 2021-04-21 DIAGNOSIS — M25552 Pain in left hip: Secondary | ICD-10-CM | POA: Diagnosis not present

## 2021-04-21 DIAGNOSIS — M1612 Unilateral primary osteoarthritis, left hip: Secondary | ICD-10-CM | POA: Diagnosis not present

## 2021-04-21 DIAGNOSIS — Z96642 Presence of left artificial hip joint: Secondary | ICD-10-CM | POA: Diagnosis not present

## 2021-04-21 DIAGNOSIS — M25652 Stiffness of left hip, not elsewhere classified: Secondary | ICD-10-CM | POA: Diagnosis not present

## 2021-05-02 DIAGNOSIS — M25552 Pain in left hip: Secondary | ICD-10-CM | POA: Diagnosis not present

## 2021-05-02 DIAGNOSIS — Z96642 Presence of left artificial hip joint: Secondary | ICD-10-CM | POA: Diagnosis not present

## 2021-05-02 DIAGNOSIS — M6281 Muscle weakness (generalized): Secondary | ICD-10-CM | POA: Diagnosis not present

## 2021-05-02 DIAGNOSIS — M25652 Stiffness of left hip, not elsewhere classified: Secondary | ICD-10-CM | POA: Diagnosis not present

## 2021-05-02 DIAGNOSIS — R262 Difficulty in walking, not elsewhere classified: Secondary | ICD-10-CM | POA: Diagnosis not present

## 2021-05-02 DIAGNOSIS — M1612 Unilateral primary osteoarthritis, left hip: Secondary | ICD-10-CM | POA: Diagnosis not present

## 2021-05-04 DIAGNOSIS — M1612 Unilateral primary osteoarthritis, left hip: Secondary | ICD-10-CM | POA: Diagnosis not present

## 2021-05-30 DIAGNOSIS — Z125 Encounter for screening for malignant neoplasm of prostate: Secondary | ICD-10-CM | POA: Diagnosis not present

## 2021-05-30 DIAGNOSIS — R7301 Impaired fasting glucose: Secondary | ICD-10-CM | POA: Diagnosis not present

## 2021-05-30 DIAGNOSIS — E782 Mixed hyperlipidemia: Secondary | ICD-10-CM | POA: Diagnosis not present

## 2021-06-02 DIAGNOSIS — M25511 Pain in right shoulder: Secondary | ICD-10-CM | POA: Diagnosis not present

## 2021-06-02 DIAGNOSIS — Z96651 Presence of right artificial knee joint: Secondary | ICD-10-CM | POA: Diagnosis not present

## 2021-06-02 DIAGNOSIS — R7301 Impaired fasting glucose: Secondary | ICD-10-CM | POA: Diagnosis not present

## 2021-06-02 DIAGNOSIS — E782 Mixed hyperlipidemia: Secondary | ICD-10-CM | POA: Diagnosis not present

## 2021-06-02 DIAGNOSIS — M25775 Osteophyte, left foot: Secondary | ICD-10-CM | POA: Diagnosis not present

## 2021-06-02 DIAGNOSIS — M1712 Unilateral primary osteoarthritis, left knee: Secondary | ICD-10-CM | POA: Diagnosis not present

## 2021-06-02 DIAGNOSIS — K219 Gastro-esophageal reflux disease without esophagitis: Secondary | ICD-10-CM | POA: Diagnosis not present

## 2021-06-02 DIAGNOSIS — I10 Essential (primary) hypertension: Secondary | ICD-10-CM | POA: Diagnosis not present

## 2021-06-02 DIAGNOSIS — R17 Unspecified jaundice: Secondary | ICD-10-CM | POA: Diagnosis not present

## 2021-06-02 DIAGNOSIS — M47896 Other spondylosis, lumbar region: Secondary | ICD-10-CM | POA: Diagnosis not present

## 2021-06-02 DIAGNOSIS — G629 Polyneuropathy, unspecified: Secondary | ICD-10-CM | POA: Diagnosis not present

## 2021-06-02 DIAGNOSIS — M1990 Unspecified osteoarthritis, unspecified site: Secondary | ICD-10-CM | POA: Diagnosis not present

## 2021-06-15 DIAGNOSIS — M1712 Unilateral primary osteoarthritis, left knee: Secondary | ICD-10-CM | POA: Diagnosis not present

## 2021-06-22 DIAGNOSIS — M1712 Unilateral primary osteoarthritis, left knee: Secondary | ICD-10-CM | POA: Diagnosis not present

## 2021-06-29 DIAGNOSIS — M1712 Unilateral primary osteoarthritis, left knee: Secondary | ICD-10-CM | POA: Diagnosis not present

## 2021-09-02 DIAGNOSIS — Z1211 Encounter for screening for malignant neoplasm of colon: Secondary | ICD-10-CM | POA: Diagnosis not present

## 2021-09-02 DIAGNOSIS — K219 Gastro-esophageal reflux disease without esophagitis: Secondary | ICD-10-CM | POA: Diagnosis not present

## 2021-10-03 DIAGNOSIS — K641 Second degree hemorrhoids: Secondary | ICD-10-CM | POA: Diagnosis not present

## 2021-10-03 DIAGNOSIS — Z1211 Encounter for screening for malignant neoplasm of colon: Secondary | ICD-10-CM | POA: Diagnosis not present

## 2021-10-03 DIAGNOSIS — K5730 Diverticulosis of large intestine without perforation or abscess without bleeding: Secondary | ICD-10-CM | POA: Diagnosis not present

## 2021-10-03 LAB — HM COLONOSCOPY

## 2021-10-20 DIAGNOSIS — E782 Mixed hyperlipidemia: Secondary | ICD-10-CM | POA: Diagnosis not present

## 2021-10-20 DIAGNOSIS — M1990 Unspecified osteoarthritis, unspecified site: Secondary | ICD-10-CM | POA: Diagnosis not present

## 2021-10-20 DIAGNOSIS — I10 Essential (primary) hypertension: Secondary | ICD-10-CM | POA: Diagnosis not present

## 2021-10-20 DIAGNOSIS — K219 Gastro-esophageal reflux disease without esophagitis: Secondary | ICD-10-CM | POA: Diagnosis not present

## 2021-11-21 DIAGNOSIS — H43811 Vitreous degeneration, right eye: Secondary | ICD-10-CM | POA: Diagnosis not present

## 2021-11-21 DIAGNOSIS — H35361 Drusen (degenerative) of macula, right eye: Secondary | ICD-10-CM | POA: Diagnosis not present

## 2021-11-21 DIAGNOSIS — H2513 Age-related nuclear cataract, bilateral: Secondary | ICD-10-CM | POA: Diagnosis not present

## 2021-12-02 DIAGNOSIS — Z125 Encounter for screening for malignant neoplasm of prostate: Secondary | ICD-10-CM | POA: Diagnosis not present

## 2021-12-02 DIAGNOSIS — R7301 Impaired fasting glucose: Secondary | ICD-10-CM | POA: Diagnosis not present

## 2021-12-02 DIAGNOSIS — I10 Essential (primary) hypertension: Secondary | ICD-10-CM | POA: Diagnosis not present

## 2021-12-02 DIAGNOSIS — E559 Vitamin D deficiency, unspecified: Secondary | ICD-10-CM | POA: Diagnosis not present

## 2021-12-07 ENCOUNTER — Other Ambulatory Visit (HOSPITAL_COMMUNITY): Payer: Self-pay | Admitting: Family Medicine

## 2021-12-07 ENCOUNTER — Ambulatory Visit (HOSPITAL_COMMUNITY)
Admission: RE | Admit: 2021-12-07 | Discharge: 2021-12-07 | Disposition: A | Discharge status: Home / Self Care | Payer: Medicare Other | Source: Ambulatory Visit | Attending: Family Medicine | Admitting: Family Medicine

## 2021-12-07 DIAGNOSIS — Z23 Encounter for immunization: Secondary | ICD-10-CM | POA: Diagnosis not present

## 2021-12-07 DIAGNOSIS — M47896 Other spondylosis, lumbar region: Secondary | ICD-10-CM | POA: Diagnosis not present

## 2021-12-07 DIAGNOSIS — R7301 Impaired fasting glucose: Secondary | ICD-10-CM | POA: Diagnosis not present

## 2021-12-07 DIAGNOSIS — M1990 Unspecified osteoarthritis, unspecified site: Secondary | ICD-10-CM | POA: Diagnosis not present

## 2021-12-07 DIAGNOSIS — Z96651 Presence of right artificial knee joint: Secondary | ICD-10-CM | POA: Diagnosis not present

## 2021-12-07 DIAGNOSIS — M542 Cervicalgia: Secondary | ICD-10-CM | POA: Insufficient documentation

## 2021-12-07 DIAGNOSIS — M25775 Osteophyte, left foot: Secondary | ICD-10-CM | POA: Diagnosis not present

## 2021-12-07 DIAGNOSIS — G629 Polyneuropathy, unspecified: Secondary | ICD-10-CM | POA: Diagnosis not present

## 2021-12-07 DIAGNOSIS — M1712 Unilateral primary osteoarthritis, left knee: Secondary | ICD-10-CM | POA: Diagnosis not present

## 2021-12-07 DIAGNOSIS — M25511 Pain in right shoulder: Secondary | ICD-10-CM | POA: Diagnosis not present

## 2021-12-07 DIAGNOSIS — I10 Essential (primary) hypertension: Secondary | ICD-10-CM | POA: Diagnosis not present

## 2021-12-07 DIAGNOSIS — R17 Unspecified jaundice: Secondary | ICD-10-CM | POA: Diagnosis not present

## 2021-12-07 DIAGNOSIS — E782 Mixed hyperlipidemia: Secondary | ICD-10-CM | POA: Diagnosis not present

## 2022-01-11 DIAGNOSIS — M1712 Unilateral primary osteoarthritis, left knee: Secondary | ICD-10-CM | POA: Diagnosis not present

## 2022-01-18 DIAGNOSIS — M1712 Unilateral primary osteoarthritis, left knee: Secondary | ICD-10-CM | POA: Diagnosis not present

## 2022-01-25 DIAGNOSIS — M1712 Unilateral primary osteoarthritis, left knee: Secondary | ICD-10-CM | POA: Diagnosis not present

## 2022-02-22 DIAGNOSIS — H43811 Vitreous degeneration, right eye: Secondary | ICD-10-CM | POA: Diagnosis not present

## 2022-02-22 DIAGNOSIS — H2513 Age-related nuclear cataract, bilateral: Secondary | ICD-10-CM | POA: Diagnosis not present

## 2022-03-08 ENCOUNTER — Encounter: Payer: Self-pay | Admitting: Ophthalmology

## 2022-03-08 DIAGNOSIS — H2511 Age-related nuclear cataract, right eye: Secondary | ICD-10-CM | POA: Diagnosis not present

## 2022-03-10 NOTE — Discharge Instructions (Signed)
   Cataract Surgery, Care After ? ?This sheet gives you information about how to care for yourself after your surgery.  Your ophthalmologist may also give you more specific instructions.  If you have problems or questions, contact your doctor at [facility], [phone]. ? ?What can I expect after the surgery? ?It is common to have: ?Itching ?Foreign body sensation (feels like a grain of sand in the eye) ?Watery discharge (excess tearing) ?Sensitivity to light and touch ?Bruising in or around the eye ?Mild blurred vision ? ?Follow these instructions at home: ?Do not touch or rub your eyes. ?You may be told to wear a protective shield or sunglasses to protect your eyes. ?Do not put a contact lens in the operative eye unless your doctor approves. ?Keep the lids and face clean and dry. ?Do not allow water to hit you directly in the face while showering. ?Keep soap and shampoo out of your eyes. ?Do not use eye makeup for 1 week. ? ?Check your eye every day for signs of infection.  Watch for: ?Redness, swelling, or pain. ?Fluid, blood or pus. ?Worsening vision. ?Worsening sensitivity to light or touch. ? ?Activity: ?During the first day, avoid bending over and reading.  You may resume reading and bending the next day. ?Do not drive or use heavy machinery for at least 24 hours. ?Avoid strenuous activities for 1 week.  Activities such as walking, treadmill, exercise bike, and climbing stairs are okay. ?Do not lift heavy (>20 pound) objects for 1 week. ?Do not do yardwork, gardening, or dirty housework (mopping, cleaning bathrooms, vacuuming, etc.) for 1 week. ?Do not swim or use a hot tub for 2 weeks. ?Ask your doctor when you can return to work. ? ?General Instructions: ?Take or apply prescription and over-the-counter medicines as directed by your doctor, including eyedrops and ointments. ?Resume medications discontinued prior to surgery, unless told otherwise by your doctor. ?Keep all follow up appointments as  scheduled. ? ?Contact a health care provider if: ?You have increased bruising around your eye. ?You have pain that is not helped with medication. ?You have a fever. ?You have fluid, pus, or blood coming from your eye or incision. ?Your sensitivity to light gets worse. ?You have spots (floaters) of flashing lights in your vision. ?You have nausea or vomiting. ? ?Go to the nearest emergency room or call 911 if: ?You have sudden loss of vision. ?You have severe, worsening eye pain. ? ?

## 2022-03-13 ENCOUNTER — Other Ambulatory Visit: Payer: Self-pay

## 2022-03-13 ENCOUNTER — Ambulatory Visit: Payer: Medicare Other | Admitting: Anesthesiology

## 2022-03-13 ENCOUNTER — Encounter: Payer: Self-pay | Admitting: Ophthalmology

## 2022-03-13 ENCOUNTER — Encounter
Admission: RE | Disposition: A | Discharge status: Home / Self Care | Payer: Self-pay | Source: Home / Self Care | Attending: Ophthalmology

## 2022-03-13 ENCOUNTER — Ambulatory Visit
Admission: RE | Admit: 2022-03-13 | Discharge: 2022-03-13 | Disposition: A | Discharge status: Home / Self Care | Payer: Medicare Other | Attending: Ophthalmology | Admitting: Ophthalmology

## 2022-03-13 DIAGNOSIS — I10 Essential (primary) hypertension: Secondary | ICD-10-CM | POA: Diagnosis not present

## 2022-03-13 DIAGNOSIS — K219 Gastro-esophageal reflux disease without esophagitis: Secondary | ICD-10-CM | POA: Insufficient documentation

## 2022-03-13 DIAGNOSIS — G629 Polyneuropathy, unspecified: Secondary | ICD-10-CM | POA: Insufficient documentation

## 2022-03-13 DIAGNOSIS — Z87891 Personal history of nicotine dependence: Secondary | ICD-10-CM | POA: Insufficient documentation

## 2022-03-13 DIAGNOSIS — M1990 Unspecified osteoarthritis, unspecified site: Secondary | ICD-10-CM | POA: Diagnosis not present

## 2022-03-13 DIAGNOSIS — H2511 Age-related nuclear cataract, right eye: Secondary | ICD-10-CM | POA: Insufficient documentation

## 2022-03-13 HISTORY — DX: Dizziness and giddiness: R42

## 2022-03-13 HISTORY — DX: Motion sickness, initial encounter: T75.3XXA

## 2022-03-13 HISTORY — DX: Prediabetes: R73.03

## 2022-03-13 HISTORY — PX: CATARACT EXTRACTION W/PHACO: SHX586

## 2022-03-13 HISTORY — DX: Polyneuropathy, unspecified: G62.9

## 2022-03-13 SURGERY — PHACOEMULSIFICATION, CATARACT, WITH IOL INSERTION
Anesthesia: Monitor Anesthesia Care | Site: Eye | Laterality: Right

## 2022-03-13 MED ORDER — SIGHTPATH DOSE#1 NA HYALUR & NA CHOND-NA HYALUR IO KIT
PACK | INTRAOCULAR | Status: DC | PRN
  Administered 2022-03-13: 1 via OPHTHALMIC

## 2022-03-13 MED ORDER — MOXIFLOXACIN HCL 0.5 % OP SOLN
OPHTHALMIC | Status: DC | PRN
  Administered 2022-03-13: .2 mL via OPHTHALMIC

## 2022-03-13 MED ORDER — SODIUM CHLORIDE 0.9% FLUSH
INTRAVENOUS | Status: DC | PRN
  Administered 2022-03-13: 10 mL via INTRAVENOUS

## 2022-03-13 MED ORDER — ACETAMINOPHEN 160 MG/5ML PO SOLN: 325.0000 mg | ORAL | Status: DC | PRN

## 2022-03-13 MED ORDER — ONDANSETRON HCL 4 MG/2ML IJ SOLN: 4.0000 mg | Freq: Once | INTRAMUSCULAR | Status: DC | PRN

## 2022-03-13 MED ORDER — ARMC OPHTHALMIC DILATING DROPS
1.0000 | OPHTHALMIC | Status: DC | PRN
  Administered 2022-03-13 (×3): 1 via OPHTHALMIC

## 2022-03-13 MED ORDER — SIGHTPATH DOSE#1 BSS IO SOLN
INTRAOCULAR | Status: DC | PRN
  Administered 2022-03-13: 15 mL

## 2022-03-13 MED ORDER — ONDANSETRON HCL 4 MG/2ML IJ SOLN
INTRAMUSCULAR | Status: DC | PRN
  Administered 2022-03-13: 4 mg via INTRAVENOUS

## 2022-03-13 MED ORDER — ACETAMINOPHEN 325 MG PO TABS: 650.0000 mg | ORAL_TABLET | Freq: Once | ORAL | Status: DC | PRN

## 2022-03-13 MED ORDER — FENTANYL CITRATE (PF) 100 MCG/2ML IJ SOLN
INTRAMUSCULAR | Status: DC | PRN
  Administered 2022-03-13 (×2): 50 ug via INTRAVENOUS

## 2022-03-13 MED ORDER — SIGHTPATH DOSE#1 BSS IO SOLN
INTRAOCULAR | Status: DC | PRN
  Administered 2022-03-13: 110 mL via OPHTHALMIC

## 2022-03-13 MED ORDER — TETRACAINE HCL 0.5 % OP SOLN
1.0000 [drp] | OPHTHALMIC | Status: DC | PRN
  Administered 2022-03-13 (×3): 1 [drp] via OPHTHALMIC

## 2022-03-13 MED ORDER — LACTATED RINGERS IV SOLN: INTRAVENOUS | Status: DC

## 2022-03-13 MED ORDER — MIDAZOLAM HCL 2 MG/2ML IJ SOLN
INTRAMUSCULAR | Status: DC | PRN
  Administered 2022-03-13 (×2): 1 mg via INTRAVENOUS

## 2022-03-13 MED ORDER — LIDOCAINE HCL (PF) 2 % IJ SOLN
INTRAOCULAR | Status: DC | PRN
  Administered 2022-03-13: 1 mL via INTRAOCULAR

## 2022-03-13 SURGICAL SUPPLY — 13 items
CATARACT SUITE SIGHTPATH (MISCELLANEOUS) ×1 IMPLANT
DISSECTOR HYDRO NUCLEUS 50X22 (MISCELLANEOUS) ×1 IMPLANT
FEE CATARACT SUITE SIGHTPATH (MISCELLANEOUS) ×1 IMPLANT
GLOVE SURG GAMMEX PI TX LF 7.5 (GLOVE) ×1 IMPLANT
GLOVE SURG SYN 8.5  E (GLOVE) ×1
GLOVE SURG SYN 8.5 E (GLOVE) ×1 IMPLANT
GLOVE SURG SYN 8.5 PF PI (GLOVE) ×1 IMPLANT
LENS IOL TECNIS EYHANCE 21.5 (Intraocular Lens) IMPLANT
NDL FILTER BLUNT 18X1 1/2 (NEEDLE) ×1 IMPLANT
NEEDLE FILTER BLUNT 18X1 1/2 (NEEDLE) ×1 IMPLANT
SYR 3ML LL SCALE MARK (SYRINGE) ×1 IMPLANT
SYR 5ML LL (SYRINGE) ×1 IMPLANT
WATER STERILE IRR 250ML POUR (IV SOLUTION) ×1 IMPLANT

## 2022-03-13 NOTE — H&P (Signed)
[place]   Primary Care Physician:  Celene Squibb, MD Ophthalmologist: Dr. Benay Pillow  Pre-Procedure History & Physical: HPI:  Lawrence White is a 72 y.o. male here for cataract surgery.   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   Gastroesophageal reflux    Hyperlipidemia    Hypertension    Motion sickness    boats, planes, [address]   Neuropathy    Feet and hands   PONV (postoperative nausea and vomiting)    Pre-diabetes    Seasonal allergies    Sinus infection    Vertigo    none for several years    Past Surgical History:  Procedure Laterality Date   APPENDECTOMY     BACK SURGERY     TOTAL HIP ARTHROPLASTY Bilateral    TOTAL KNEE ARTHROPLASTY Right 03/07/2019   Procedure: TOTAL KNEE ARTHROPLASTY;  Surgeon: Earlie Server, MD;  Location: [facility];  Service: Orthopedics;  Laterality: Right;   VASECTOMY      Prior to Admission medications   Medication Sig Start Date End Date Taking? Authorizing Provider  acetaminophen (TYLENOL) 500 MG tablet Take 650 mg by mouth every 6 (six) hours as needed (pain. (MAX 4-5 TABLETS/24HRS.)). Takes 2 tablets every 8 hours for pain.   Yes [provider]  atorvastatin (LIPITOR) 40 MG tablet Take 40 mg by mouth daily. 01/28/19  Yes [provider]  gabapentin (NEURONTIN) 100 MG capsule Take 100 mg by mouth daily. 01/19/20  Yes [provider]  loratadine (CLARITIN) 10 MG tablet Take 10 mg by mouth at bedtime.    Yes [provider]  losartan (COZAAR) 50 MG tablet Take 50 mg by mouth at bedtime.  06/23/16  Yes [provider]  meloxicam (MOBIC) 15 MG tablet Take 15 mg by mouth at bedtime.   Yes [provider]  omeprazole (PRILOSEC) 20 MG capsule Take 20 mg by mouth every Monday, Tuesday, Wednesday, Thursday, and Friday. Before supper 03/17/15  Yes [provider]   fluticasone (FLONASE) 50 MCG/ACT nasal spray Place 1 spray into both nostrils daily.     [provider]  HYDROcodone-acetaminophen (NORCO/VICODIN) 5-325 MG tablet Take by mouth. Patient not taking: Reported on 03/13/2022 11/19/19   [provider]  methylPREDNISolone (MEDROL DOSEPAK) 4 MG TBPK tablet 6 day dose pack - take as directed Patient not taking: Reported on 03/13/2022 02/18/20   Tyson Dense T, DPM    Allergies as of 02/27/2022 - Review Complete 02/18/2020  Allergen Reaction Noted   Penicillins Hives and Swelling 05/19/2010   Sulfa antibiotics Hives and Swelling 05/19/2010   Adhesive [tape] Hives 12/23/2018   Cefdinir Diarrhea 11/08/2016   Gabapentin Other (See Comments) 11/08/2016   Pregabalin Diarrhea 11/08/2016   Sulfacetamide sodium-sulfur  11/08/2016    History reviewed. No pertinent family history.  Social History   Socioeconomic History   Marital status: Married    Spouse name: Not on file   Number of children: Not on file   Years of education: Not on file   Highest education level: Not on file  Occupational History   Not on file  Tobacco Use   Smoking status: Former    Packs/day: 1.50    Years: 19.00    Total pack years: 28.50    Types: Cigarettes    Quit date: 01/28/1974    Years  since quitting: 48.1   Smokeless tobacco: Former    Types: Chew    Quit date: 12/02/1987  Vaping Use   Vaping Use: Never used  Substance and Sexual Activity   Alcohol use: No   Drug use: Never   Sexual activity: Not on file  Other Topics Concern   Not on file  Social History Narrative   Not on file   Social Determinants of Health   Financial Resource Strain: Not on file  Food Insecurity: Not on file  Transportation Needs: Not on file  Physical Activity: Not on file  Stress: Not on file  Social Connections: Not on file  Intimate Partner Violence: Not on file    Review of Systems: See HPI, otherwise negative ROS  Physical Exam: BP 127/89    Temp (!) 97.5 F (36.4 C) (Tympanic)   Ht 6' 0.01" (1.829 m)   Wt 97.7 kg   SpO2 99%   BMI 29.19 kg/m  General:   Alert, cooperative in NAD Head:  Normocephalic and atraumatic. Respiratory:  Normal work of breathing. Cardiovascular:  RRR  Impression/Plan: Lawrence White is here for cataract surgery.  Risks, benefits, limitations, and alternatives regarding cataract surgery have been reviewed with the patient.  Questions have been answered.  All parties agreeable.   Benay Pillow, MD  03/13/2022, 1:52 PM

## 2022-03-13 NOTE — Op Note (Signed)
OPERATIVE NOTE  WHEELER INCORVAIA [id number] 03/13/2022   PREOPERATIVE DIAGNOSIS:  Nuclear sclerotic cataract right eye.  H25.11   POSTOPERATIVE DIAGNOSIS:    Nuclear sclerotic cataract right eye.     PROCEDURE:  Phacoemusification with posterior chamber intraocular lens placement of the right eye   LENS:   Implant Name Type Inv. Item Serial No. Manufacturer Lot No. LRB No. Used Action  LENS IOL TECNIS EYHANCE 21.5 - [id number] Intraocular Lens LENS IOL TECNIS EYHANCE 21.5 [id number] [employer]  Right 1 Implanted       Procedure(s): CATARACT EXTRACTION PHACO AND INTRAOCULAR LENS PLACEMENT (IOC) RIGHT  6.20  00:42.1 (Right)  DIB00 +21.5   ULTRASOUND TIME: 0 minutes 42 seconds.  CDE 6.20   SURGEON:  Benay Pillow, MD, MPH  ANESTHESIOLOGIST: Anesthesiologist: Darrin Nipper, MD CRNA: Hilbert Odor, CRNA   ANESTHESIA:  Topical with tetracaine drops augmented with 1% preservative-free intracameral lidocaine.  ESTIMATED BLOOD LOSS: less than 1 mL.   COMPLICATIONS:  None.   DESCRIPTION OF PROCEDURE:  The patient was identified in the holding room and transported to the operating room and placed in the supine position under the operating microscope.  The right eye was identified as the operative eye and it was prepped and draped in the usual sterile ophthalmic fashion.   A 1.0 millimeter clear-corneal paracentesis was made at the 10:30 position. 0.5 ml of preservative-free 1% lidocaine with epinephrine was injected into the anterior chamber.  The anterior chamber was filled with viscoelastic.  A 2.4 millimeter keratome was used to make a near-clear corneal incision at the 8:00 position.  A curvilinear capsulorrhexis was made with a cystotome and capsulorrhexis forceps.  Balanced salt solution was used to hydrodissect and hydrodelineate the nucleus.   Phacoemulsification was then used in stop and chop fashion to remove the lens nucleus and epinucleus.  The remaining cortex was then  removed using the irrigation and aspiration handpiece. Viscoelastic was then placed into the capsular bag to distend it for lens placement.  A lens was then injected into the capsular bag.  The remaining viscoelastic was aspirated.   Wounds were hydrated with balanced salt solution.  The anterior chamber was inflated to a physiologic pressure with balanced salt solution.   Intracameral vigamox 0.1 mL undiluted was injected into the eye and a drop placed onto the ocular surface.  No wound leaks were noted.  The patient was taken to the recovery room in stable condition without complications of anesthesia or surgery  Benay Pillow 03/13/2022, 2:21 PM

## 2022-03-13 NOTE — Anesthesia Preprocedure Evaluation (Addendum)
Anesthesia Evaluation  Patient identified by MRN, date of birth, ID band Patient awake    Reviewed: Allergy & Precautions, NPO status , Patient's Chart, lab work & pertinent test results  History of Anesthesia Complications (+) PONV and history of anesthetic complications  Airway Mallampati: IV   Neck ROM: Full    Dental  (+) Missing   Pulmonary former smoker (quit 1975)   Pulmonary exam normal breath sounds clear to auscultation       Cardiovascular hypertension, Normal cardiovascular exam Rhythm:Regular Rate:Normal     Neuro/Psych  Neuromuscular disease (neuropathy)    GI/Hepatic ,GERD  ,,  Endo/Other  Prediabetes   Renal/GU negative Renal ROS     Musculoskeletal  (+) Arthritis ,    Abdominal   Peds  Hematology negative hematology ROS (+)   Anesthesia Other Findings   Reproductive/Obstetrics                             Anesthesia Physical Anesthesia Plan  ASA: 2  Anesthesia Plan: MAC   Post-op Pain Management:    Induction: Intravenous  PONV Risk Score and Plan: 2 and Treatment may vary due to age or medical condition, Midazolam and TIVA  Airway Management Planned: Natural Airway and Nasal Cannula  Additional Equipment:   Intra-op Plan:   Post-operative Plan:   Informed Consent: I have reviewed the patients History and Physical, chart, labs and discussed the procedure including the risks, benefits and alternatives for the proposed anesthesia with the patient or authorized representative who has indicated his/her understanding and acceptance.     Dental advisory given  Plan Discussed with: CRNA  Anesthesia Plan Comments: (LMA/GETA backup discussed.  Patient consented for risks of anesthesia including but not limited to:  - adverse reactions to medications - damage to eyes, teeth, lips or other oral mucosa - nerve damage due to positioning  - sore throat or  hoarseness - damage to heart, brain, nerves, lungs, other parts of body or loss of life  Informed patient about role of CRNA in peri- and intra-operative care.  Patient voiced understanding.)        Anesthesia Quick Evaluation

## 2022-03-13 NOTE — Transfer of Care (Signed)
Immediate Anesthesia Transfer of Care Note  Patient: Lawrence White  Procedure(s) Performed: CATARACT EXTRACTION PHACO AND INTRAOCULAR LENS PLACEMENT (IOC) RIGHT  6.20  00:42.1 (Right: Eye)  Patient Location: PACU  Anesthesia Type: MAC  Level of Consciousness: awake, alert  and patient cooperative  Airway and Oxygen Therapy: Patient Spontanous Breathing and Patient connected to supplemental oxygen  Post-op Assessment: Post-op Vital signs reviewed, Patient's Cardiovascular Status Stable, Respiratory Function Stable, Patent Airway and No signs of Nausea or vomiting  Post-op Vital Signs: Reviewed and stable  Complications: No notable events documented.

## 2022-03-13 NOTE — Anesthesia Postprocedure Evaluation (Signed)
Anesthesia Post Note  Patient: Lawrence White  Procedure(s) Performed: CATARACT EXTRACTION PHACO AND INTRAOCULAR LENS PLACEMENT (IOC) RIGHT  6.20  00:42.1 (Right: Eye)  Patient location during evaluation: PACU Anesthesia Type: MAC Level of consciousness: awake and alert, oriented and patient cooperative Pain management: pain level controlled Vital Signs Assessment: post-procedure vital signs reviewed and stable Respiratory status: spontaneous breathing, nonlabored ventilation and respiratory function stable Cardiovascular status: blood pressure returned to baseline and stable Postop Assessment: adequate PO intake Anesthetic complications: no   No notable events documented.   Last Vitals:  Vitals:   03/13/22 1422 03/13/22 1427  BP: 122/76 117/77  Pulse: 66 64  Resp: 14 14  Temp: 36.4 C 36.4 C  SpO2: 98% 99%    Last Pain:  Vitals:   03/13/22 1427  TempSrc:   PainSc: 0-No pain                 Darrin Nipper

## 2022-03-15 ENCOUNTER — Encounter: Payer: Self-pay | Admitting: Ophthalmology

## 2022-03-16 ENCOUNTER — Encounter: Payer: Self-pay | Admitting: Ophthalmology

## 2022-03-23 NOTE — Discharge Instructions (Signed)

## 2022-03-24 DIAGNOSIS — M25552 Pain in left hip: Secondary | ICD-10-CM | POA: Diagnosis not present

## 2022-03-27 ENCOUNTER — Other Ambulatory Visit: Payer: Self-pay

## 2022-03-27 ENCOUNTER — Ambulatory Visit: Payer: Medicare Other | Admitting: Anesthesiology

## 2022-03-27 ENCOUNTER — Encounter
Admission: RE | Disposition: A | Discharge status: Home / Self Care | Payer: Self-pay | Source: Home / Self Care | Attending: Ophthalmology

## 2022-03-27 ENCOUNTER — Ambulatory Visit
Admission: RE | Admit: 2022-03-27 | Discharge: 2022-03-27 | Disposition: A | Discharge status: Home / Self Care | Payer: Medicare Other | Attending: Ophthalmology | Admitting: Ophthalmology

## 2022-03-27 ENCOUNTER — Encounter: Payer: Self-pay | Admitting: Ophthalmology

## 2022-03-27 DIAGNOSIS — K219 Gastro-esophageal reflux disease without esophagitis: Secondary | ICD-10-CM | POA: Diagnosis not present

## 2022-03-27 DIAGNOSIS — G629 Polyneuropathy, unspecified: Secondary | ICD-10-CM | POA: Insufficient documentation

## 2022-03-27 DIAGNOSIS — I10 Essential (primary) hypertension: Secondary | ICD-10-CM | POA: Diagnosis not present

## 2022-03-27 DIAGNOSIS — H2512 Age-related nuclear cataract, left eye: Secondary | ICD-10-CM | POA: Diagnosis not present

## 2022-03-27 DIAGNOSIS — Z87891 Personal history of nicotine dependence: Secondary | ICD-10-CM | POA: Insufficient documentation

## 2022-03-27 DIAGNOSIS — R7303 Prediabetes: Secondary | ICD-10-CM | POA: Insufficient documentation

## 2022-03-27 HISTORY — PX: CATARACT EXTRACTION W/PHACO: SHX586

## 2022-03-27 SURGERY — PHACOEMULSIFICATION, CATARACT, WITH IOL INSERTION
Anesthesia: Monitor Anesthesia Care | Site: Eye | Laterality: Left

## 2022-03-27 MED ORDER — SIGHTPATH DOSE#1 BSS IO SOLN
INTRAOCULAR | Status: DC | PRN
  Administered 2022-03-27: 15 mL via INTRAOCULAR

## 2022-03-27 MED ORDER — SODIUM CHLORIDE 0.9% FLUSH
INTRAVENOUS | Status: DC | PRN
  Administered 2022-03-27: 10 mL via INTRAVENOUS

## 2022-03-27 MED ORDER — ARMC OPHTHALMIC DILATING DROPS
1.0000 | OPHTHALMIC | Status: DC | PRN
  Administered 2022-03-27 (×3): 1 via OPHTHALMIC

## 2022-03-27 MED ORDER — MOXIFLOXACIN HCL 0.5 % OP SOLN
OPHTHALMIC | Status: DC | PRN
  Administered 2022-03-27: .2 mL via OPHTHALMIC

## 2022-03-27 MED ORDER — MIDAZOLAM HCL 2 MG/2ML IJ SOLN
INTRAMUSCULAR | Status: DC | PRN
  Administered 2022-03-27: 1 mg via INTRAVENOUS

## 2022-03-27 MED ORDER — TETRACAINE HCL 0.5 % OP SOLN
1.0000 [drp] | OPHTHALMIC | Status: DC | PRN
  Administered 2022-03-27 (×3): 1 [drp] via OPHTHALMIC

## 2022-03-27 MED ORDER — SIGHTPATH DOSE#1 BSS IO SOLN
INTRAOCULAR | Status: DC | PRN
  Administered 2022-03-27: 109 mL via OPHTHALMIC

## 2022-03-27 MED ORDER — FENTANYL CITRATE (PF) 100 MCG/2ML IJ SOLN
INTRAMUSCULAR | Status: DC | PRN
  Administered 2022-03-27: 25 ug via INTRAVENOUS

## 2022-03-27 MED ORDER — LACTATED RINGERS IV SOLN: INTRAVENOUS | Status: DC

## 2022-03-27 MED ORDER — SIGHTPATH DOSE#1 NA HYALUR & NA CHOND-NA HYALUR IO KIT
PACK | INTRAOCULAR | Status: DC | PRN
  Administered 2022-03-27: 1 via OPHTHALMIC

## 2022-03-27 MED ORDER — LIDOCAINE HCL (PF) 2 % IJ SOLN
INTRAOCULAR | Status: DC | PRN
  Administered 2022-03-27: 4 mL via INTRAOCULAR

## 2022-03-27 SURGICAL SUPPLY — 15 items
CANNULA ANT/CHMB 27G (MISCELLANEOUS) IMPLANT
CANNULA ANT/CHMB 27GA (MISCELLANEOUS) IMPLANT
CATARACT SUITE SIGHTPATH (MISCELLANEOUS) ×1 IMPLANT
DISSECTOR HYDRO NUCLEUS 50X22 (MISCELLANEOUS) ×1 IMPLANT
FEE CATARACT SUITE SIGHTPATH (MISCELLANEOUS) ×1 IMPLANT
GLOVE SURG GAMMEX PI TX LF 7.5 (GLOVE) ×1 IMPLANT
GLOVE SURG SYN 8.5  E (GLOVE) ×1
GLOVE SURG SYN 8.5 E (GLOVE) ×1 IMPLANT
GLOVE SURG SYN 8.5 PF PI (GLOVE) ×1 IMPLANT
LENS IOL TECNIS EYHANCE 21.0 (Intraocular Lens) IMPLANT
NDL FILTER BLUNT 18X1 1/2 (NEEDLE) ×1 IMPLANT
NEEDLE FILTER BLUNT 18X1 1/2 (NEEDLE) ×1 IMPLANT
SYR 3ML LL SCALE MARK (SYRINGE) ×1 IMPLANT
SYR 5ML LL (SYRINGE) ×1 IMPLANT
WATER STERILE IRR 250ML POUR (IV SOLUTION) ×1 IMPLANT

## 2022-03-27 NOTE — Anesthesia Postprocedure Evaluation (Signed)
Anesthesia Post Note  Patient: Lawrence White  Procedure(s) Performed: CATARACT EXTRACTION PHACO AND INTRAOCULAR LENS PLACEMENT (IOC) LEFT 5.35 00:37.0 (Left: Eye)  Patient location during evaluation: PACU Anesthesia Type: MAC Level of consciousness: awake and alert Pain management: pain level controlled Vital Signs Assessment: post-procedure vital signs reviewed and stable Respiratory status: spontaneous breathing, nonlabored ventilation, respiratory function stable and patient connected to nasal cannula oxygen Cardiovascular status: stable and blood pressure returned to baseline Postop Assessment: no apparent nausea or vomiting Anesthetic complications: no   No notable events documented.   Last Vitals:  Vitals:   03/27/22 1214 03/27/22 1219  BP: 107/75 115/83  Pulse: 62 (!) 58  Resp: 18 20  Temp: (!) 36.3 C (!) 36.4 C  SpO2: 98% 98%    Last Pain:  Vitals:   03/27/22 1219  TempSrc:   PainSc: 0-No pain                 Martha Clan

## 2022-03-27 NOTE — Op Note (Signed)
OPERATIVE NOTE  Lawrence White [id number] 03/27/2022   PREOPERATIVE DIAGNOSIS:  Nuclear sclerotic cataract left eye.  H25.12   POSTOPERATIVE DIAGNOSIS:    Nuclear sclerotic cataract left eye.     PROCEDURE:  Phacoemusification with posterior chamber intraocular lens placement of the left eye   LENS:   Implant Name Type Inv. Item Serial No. Manufacturer Lot No. LRB No. Used Action  LENS IOL TECNIS EYHANCE 21.0 - [id number] Intraocular Lens LENS IOL TECNIS EYHANCE 21.0 [id number] [employer]  Left 1 Implanted      Procedure(s): CATARACT EXTRACTION PHACO AND INTRAOCULAR LENS PLACEMENT (IOC) LEFT 5.35 00:37.0 (Left)  DIB00 +21.0   ULTRASOUND TIME: 0 minutes 37 seconds.  CDE 5.35   SURGEON:  Benay Pillow, MD, MPH   ANESTHESIA:  Topical with tetracaine drops augmented with 1% preservative-free intracameral lidocaine.  ESTIMATED BLOOD LOSS: <1 mL   COMPLICATIONS:  None.   DESCRIPTION OF PROCEDURE:  The patient was identified in the holding room and transported to the operating room and placed in the supine position under the operating microscope.  The left eye was identified as the operative eye and it was prepped and draped in the usual sterile ophthalmic fashion.   A 1.0 millimeter clear-corneal paracentesis was made at the 5:00 position. 0.5 ml of preservative-free 1% lidocaine with epinephrine was injected into the anterior chamber.  The anterior chamber was filled with viscoelastic.  A 2.4 millimeter keratome was used to make a near-clear corneal incision at the 2:00 position.  A curvilinear capsulorrhexis was made with a cystotome and capsulorrhexis forceps.  Balanced salt solution was used to hydrodissect and hydrodelineate the nucleus.   Phacoemulsification was then used in stop and chop fashion to remove the lens nucleus and epinucleus.  The remaining cortex was then removed using the irrigation and aspiration handpiece. Viscoelastic was then placed into the capsular bag to distend  it for lens placement.  A lens was then injected into the capsular bag.  The remaining viscoelastic was aspirated.   Wounds were hydrated with balanced salt solution.  The anterior chamber was inflated to a physiologic pressure with balanced salt solution.  Intracameral vigamox 0.1 mL undiltued was injected into the eye and a drop placed onto the ocular surface.  No wound leaks were noted.  The patient was taken to the recovery room in stable condition without complications of anesthesia or surgery  Benay Pillow 03/27/2022, 12:12 PM

## 2022-03-27 NOTE — Anesthesia Preprocedure Evaluation (Signed)
Anesthesia Evaluation  Patient identified by MRN, date of birth, ID band Patient awake    Reviewed: Allergy & Precautions, NPO status , Patient's Chart, lab work & pertinent test results  History of Anesthesia Complications (+) PONV and history of anesthetic complications  Airway Mallampati: IV   Neck ROM: Full    Dental  (+) Missing   Pulmonary former smoker   Pulmonary exam normal breath sounds clear to auscultation       Cardiovascular hypertension, Normal cardiovascular exam Rhythm:Regular Rate:Normal     Neuro/Psych  Neuromuscular disease (neuropathy)    GI/Hepatic ,GERD  ,,  Endo/Other  Prediabetes   Renal/GU negative Renal ROS     Musculoskeletal  (+) Arthritis ,    Abdominal   Peds  Hematology negative hematology ROS (+)   Anesthesia Other Findings   Reproductive/Obstetrics                              Anesthesia Physical Anesthesia Plan  ASA: 2  Anesthesia Plan: MAC   Post-op Pain Management:    Induction: Intravenous  PONV Risk Score and Plan: 2 and Treatment may vary due to age or medical condition, Midazolam and TIVA  Airway Management Planned: Natural Airway and Nasal Cannula  Additional Equipment:   Intra-op Plan:   Post-operative Plan:   Informed Consent: I have reviewed the patients History and Physical, chart, labs and discussed the procedure including the risks, benefits and alternatives for the proposed anesthesia with the patient or authorized representative who has indicated his/her understanding and acceptance.     Dental advisory given  Plan Discussed with: CRNA  Anesthesia Plan Comments: (LMA/GETA backup discussed.  Patient consented for risks of anesthesia including but not limited to:  - adverse reactions to medications - damage to eyes, teeth, lips or other oral mucosa - nerve damage due to positioning  - sore throat or hoarseness -  damage to heart, brain, nerves, lungs, other parts of body or loss of life  Informed patient about role of CRNA in peri- and intra-operative care.  Patient voiced understanding.)         Anesthesia Quick Evaluation

## 2022-03-27 NOTE — Transfer of Care (Signed)
Immediate Anesthesia Transfer of Care Note  Patient: Lawrence White  Procedure(s) Performed: CATARACT EXTRACTION PHACO AND INTRAOCULAR LENS PLACEMENT (IOC) LEFT 5.35 00:37.0 (Left: Eye)  Patient Location: PACU  Anesthesia Type:MAC  Level of Consciousness: awake, alert , and oriented  Airway & Oxygen Therapy: Patient Spontanous Breathing  Post-op Assessment: Report given to RN  Post vital signs: Temp over 967.   See flow sheetReviewed and stable  Last Vitals:  Vitals Value Taken Time  BP 107/75 03/27/22 1215  Temp    Pulse 57 03/27/22 1216  Resp 13 03/27/22 1216  SpO2 97 % 03/27/22 1216  Vitals shown include unvalidated device data.  Last Pain:  Vitals:   03/27/22 1058  TempSrc: Temporal  PainSc: 0-No pain         Complications: No notable events documented.

## 2022-03-27 NOTE — H&P (Signed)
[place]   Primary Care Physician:  Celene Squibb, MD Ophthalmologist: Dr. Benay Pillow  Pre-Procedure History & Physical: HPI:  Lawrence White is a 72 y.o. male here for cataract surgery.   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   Gastroesophageal reflux    Hyperlipidemia    Hypertension    Motion sickness    boats, planes, [address]   Neuropathy    Feet and hands   PONV (postoperative nausea and vomiting)    Pre-diabetes    Seasonal allergies    Sinus infection    Vertigo    none for several years    Past Surgical History:  Procedure Laterality Date   APPENDECTOMY     BACK SURGERY     CATARACT EXTRACTION W/PHACO Right 03/13/2022   Procedure: CATARACT EXTRACTION PHACO AND INTRAOCULAR LENS PLACEMENT (IOC) RIGHT  6.20  00:42.1;  Surgeon: Eulogio Bear, MD;  Location: [place];  Service: Ophthalmology;  Laterality: Right;   TOTAL HIP ARTHROPLASTY Bilateral    TOTAL KNEE ARTHROPLASTY Right 03/07/2019   Procedure: TOTAL KNEE ARTHROPLASTY;  Surgeon: Earlie Server, MD;  Location: [facility];  Service: Orthopedics;  Laterality: Right;   VASECTOMY      Prior to Admission medications   Medication Sig Start Date End Date Taking? Authorizing Provider  acetaminophen (TYLENOL) 500 MG tablet Take 650 mg by mouth every 6 (six) hours as needed (pain. (MAX 4-5 TABLETS/24HRS.)). Takes 2 tablets every 8 hours for pain.   Yes [provider]  atorvastatin (LIPITOR) 40 MG tablet Take 40 mg by mouth daily. 01/28/19  Yes [provider]  fluticasone (FLONASE) 50 MCG/ACT nasal spray Place 1 spray into both nostrils daily as needed.   Yes [provider]  gabapentin (NEURONTIN) 100 MG capsule Take 100 mg by mouth daily. 01/19/20  Yes [provider]  loratadine (CLARITIN) 10 MG tablet Take 10 mg by mouth at bedtime.    Yes [provider]  losartan (COZAAR) 50 MG tablet Take 50 mg by mouth at bedtime.  06/23/16  Yes [provider]  meloxicam (MOBIC) 15 MG tablet Take 15 mg by mouth at bedtime.   Yes [provider]  omeprazole (PRILOSEC) 20 MG capsule Take 20 mg by mouth every Monday, Tuesday, Wednesday, Thursday, and Friday. Before supper 03/17/15  Yes [provider]    Allergies as of 02/27/2022 - Review Complete 02/18/2020  Allergen Reaction Noted   Penicillins Hives and Swelling 05/19/2010   Sulfa antibiotics Hives and Swelling 05/19/2010   Adhesive [tape] Hives 12/23/2018   Cefdinir Diarrhea 11/08/2016   Gabapentin Other (See Comments) 11/08/2016   Pregabalin Diarrhea 11/08/2016   Sulfacetamide sodium-sulfur  11/08/2016    History reviewed. No pertinent family history.  Social History   Socioeconomic History   Marital status: Married    Spouse name: Not on file   Number of children: Not on file   Years of education: Not on file   Highest education level: Not on file  Occupational History   Not on file  Tobacco Use   Smoking status: Former    Packs/day: 1.50    Years: 19.00    Total pack years: 28.50    Types: Cigarettes    Quit date: 01/28/1974    Years since quitting: 48.1   Smokeless tobacco: Former  Types: Sarina Ser    Quit date: 12/02/1987  Vaping Use   Vaping Use: Never used  Substance and Sexual Activity   Alcohol use: No   Drug use: Never   Sexual activity: Not on file  Other Topics Concern   Not on file  Social History Narrative   Not on file   Social Determinants of Health   Financial Resource Strain: Not on file  Food Insecurity: Not on file  Transportation Needs: Not on file  Physical Activity: Not on file  Stress: Not on file  Social Connections: Not on file  Intimate Partner Violence: Not on file    Review of Systems: See HPI, otherwise negative ROS  Physical Exam: BP (!) 125/93   Pulse 61   Temp 97.6 F (36.4 C) (Temporal)    Ht 6' (1.829 m)   Wt 98.4 kg   SpO2 100%   BMI 29.43 kg/m  General:   Alert, cooperative in NAD Head:  Normocephalic and atraumatic. Respiratory:  Normal work of breathing. Cardiovascular:  RRR  Impression/Plan: Lawrence White is here for cataract surgery.  Risks, benefits, limitations, and alternatives regarding cataract surgery have been reviewed with the patient.  Questions have been answered.  All parties agreeable.   Benay Pillow, MD  03/27/2022, 11:45 AM

## 2022-03-28 ENCOUNTER — Encounter: Payer: Self-pay | Admitting: Ophthalmology

## 2022-03-31 DIAGNOSIS — M25552 Pain in left hip: Secondary | ICD-10-CM | POA: Diagnosis not present

## 2022-04-14 DIAGNOSIS — M542 Cervicalgia: Secondary | ICD-10-CM | POA: Diagnosis not present

## 2022-04-18 DIAGNOSIS — Z961 Presence of intraocular lens: Secondary | ICD-10-CM | POA: Diagnosis not present

## 2022-04-21 DIAGNOSIS — M542 Cervicalgia: Secondary | ICD-10-CM | POA: Diagnosis not present

## 2022-04-27 DIAGNOSIS — M542 Cervicalgia: Secondary | ICD-10-CM | POA: Diagnosis not present

## 2022-05-03 DIAGNOSIS — M542 Cervicalgia: Secondary | ICD-10-CM | POA: Diagnosis not present

## 2022-05-05 DIAGNOSIS — M542 Cervicalgia: Secondary | ICD-10-CM | POA: Diagnosis not present

## 2022-05-10 DIAGNOSIS — M542 Cervicalgia: Secondary | ICD-10-CM | POA: Diagnosis not present

## 2022-05-17 DIAGNOSIS — M542 Cervicalgia: Secondary | ICD-10-CM | POA: Diagnosis not present

## 2022-06-05 DIAGNOSIS — R7301 Impaired fasting glucose: Secondary | ICD-10-CM | POA: Diagnosis not present

## 2022-06-05 DIAGNOSIS — Z125 Encounter for screening for malignant neoplasm of prostate: Secondary | ICD-10-CM | POA: Diagnosis not present

## 2022-06-05 DIAGNOSIS — I10 Essential (primary) hypertension: Secondary | ICD-10-CM | POA: Diagnosis not present

## 2022-06-05 DIAGNOSIS — E559 Vitamin D deficiency, unspecified: Secondary | ICD-10-CM | POA: Diagnosis not present

## 2022-06-08 DIAGNOSIS — M1990 Unspecified osteoarthritis, unspecified site: Secondary | ICD-10-CM | POA: Diagnosis not present

## 2022-06-08 DIAGNOSIS — G629 Polyneuropathy, unspecified: Secondary | ICD-10-CM | POA: Diagnosis not present

## 2022-06-08 DIAGNOSIS — Z96651 Presence of right artificial knee joint: Secondary | ICD-10-CM | POA: Diagnosis not present

## 2022-06-08 DIAGNOSIS — I10 Essential (primary) hypertension: Secondary | ICD-10-CM | POA: Diagnosis not present

## 2022-06-08 DIAGNOSIS — M25511 Pain in right shoulder: Secondary | ICD-10-CM | POA: Diagnosis not present

## 2022-06-08 DIAGNOSIS — M1712 Unilateral primary osteoarthritis, left knee: Secondary | ICD-10-CM | POA: Diagnosis not present

## 2022-06-08 DIAGNOSIS — R17 Unspecified jaundice: Secondary | ICD-10-CM | POA: Diagnosis not present

## 2022-06-08 DIAGNOSIS — E782 Mixed hyperlipidemia: Secondary | ICD-10-CM | POA: Diagnosis not present

## 2022-06-08 DIAGNOSIS — E1165 Type 2 diabetes mellitus with hyperglycemia: Secondary | ICD-10-CM | POA: Diagnosis not present

## 2022-06-08 DIAGNOSIS — M25775 Osteophyte, left foot: Secondary | ICD-10-CM | POA: Diagnosis not present

## 2022-06-08 DIAGNOSIS — K219 Gastro-esophageal reflux disease without esophagitis: Secondary | ICD-10-CM | POA: Diagnosis not present

## 2022-06-08 DIAGNOSIS — M47896 Other spondylosis, lumbar region: Secondary | ICD-10-CM | POA: Diagnosis not present

## 2022-08-12 DIAGNOSIS — R42 Dizziness and giddiness: Secondary | ICD-10-CM | POA: Diagnosis not present

## 2022-10-09 DIAGNOSIS — M25561 Pain in right knee: Secondary | ICD-10-CM | POA: Diagnosis not present

## 2022-11-29 DIAGNOSIS — Z23 Encounter for immunization: Secondary | ICD-10-CM | POA: Diagnosis not present

## 2022-12-05 DIAGNOSIS — E559 Vitamin D deficiency, unspecified: Secondary | ICD-10-CM | POA: Diagnosis not present

## 2022-12-05 DIAGNOSIS — E1165 Type 2 diabetes mellitus with hyperglycemia: Secondary | ICD-10-CM | POA: Diagnosis not present

## 2022-12-05 DIAGNOSIS — I10 Essential (primary) hypertension: Secondary | ICD-10-CM | POA: Diagnosis not present

## 2022-12-05 DIAGNOSIS — Z125 Encounter for screening for malignant neoplasm of prostate: Secondary | ICD-10-CM | POA: Diagnosis not present

## 2022-12-05 LAB — BASIC METABOLIC PANEL
BUN: 16 (ref 4–21)
CO2: 24 — AB (ref 13–22)
Chloride: 103 (ref 99–108)
Creatinine: 0.9 (ref 0.6–1.3)
Glucose: 111
Potassium: 4.3 meq/L (ref 3.5–5.1)
Sodium: 141 (ref 137–147)

## 2022-12-05 LAB — MICROALBUMIN / CREATININE URINE RATIO: Microalb Creat Ratio: 6

## 2022-12-05 LAB — LIPID PANEL
Cholesterol: 130 (ref 0–200)
HDL: 40 (ref 35–70)
LDL Cholesterol: 69
Triglycerides: 113 (ref 40–160)

## 2022-12-05 LAB — CBC AND DIFFERENTIAL
HCT: 44 (ref 41–53)
Hemoglobin: 14.5 (ref 13.5–17.5)
Neutrophils Absolute: 4.1
Platelets: 236 10*3/uL (ref 150–400)
WBC: 7.8

## 2022-12-05 LAB — HEPATIC FUNCTION PANEL
ALT: 30 U/L (ref 10–40)
AST: 32 (ref 14–40)
Alkaline Phosphatase: 67 (ref 25–125)
Bilirubin, Total: 1.6

## 2022-12-05 LAB — COMPREHENSIVE METABOLIC PANEL
Albumin: 4.4 (ref 3.5–5.0)
Calcium: 9.6 (ref 8.7–10.7)
Globulin: 2.2
eGFR: 90

## 2022-12-05 LAB — PSA: PSA: 0.8

## 2022-12-05 LAB — VITAMIN D 25 HYDROXY (VIT D DEFICIENCY, FRACTURES): Vit D, 25-Hydroxy: 53.1

## 2022-12-05 LAB — HEMOGLOBIN A1C: Hemoglobin A1C: 6.1

## 2022-12-05 LAB — PROTEIN / CREATININE RATIO, URINE
Albumin, U: 9.2
Creatinine, Urine: 141.8

## 2022-12-05 LAB — CBC: RBC: 4.94 (ref 3.87–5.11)

## 2022-12-08 DIAGNOSIS — E1165 Type 2 diabetes mellitus with hyperglycemia: Secondary | ICD-10-CM | POA: Diagnosis not present

## 2022-12-08 DIAGNOSIS — K219 Gastro-esophageal reflux disease without esophagitis: Secondary | ICD-10-CM | POA: Diagnosis not present

## 2022-12-08 DIAGNOSIS — M25775 Osteophyte, left foot: Secondary | ICD-10-CM | POA: Diagnosis not present

## 2022-12-08 DIAGNOSIS — G629 Polyneuropathy, unspecified: Secondary | ICD-10-CM | POA: Diagnosis not present

## 2022-12-08 DIAGNOSIS — M1712 Unilateral primary osteoarthritis, left knee: Secondary | ICD-10-CM | POA: Diagnosis not present

## 2022-12-08 DIAGNOSIS — M47896 Other spondylosis, lumbar region: Secondary | ICD-10-CM | POA: Diagnosis not present

## 2022-12-08 DIAGNOSIS — M1990 Unspecified osteoarthritis, unspecified site: Secondary | ICD-10-CM | POA: Diagnosis not present

## 2022-12-08 DIAGNOSIS — I10 Essential (primary) hypertension: Secondary | ICD-10-CM | POA: Diagnosis not present

## 2022-12-08 DIAGNOSIS — G5603 Carpal tunnel syndrome, bilateral upper limbs: Secondary | ICD-10-CM | POA: Insufficient documentation

## 2022-12-08 DIAGNOSIS — M25511 Pain in right shoulder: Secondary | ICD-10-CM | POA: Diagnosis not present

## 2022-12-08 DIAGNOSIS — Z96651 Presence of right artificial knee joint: Secondary | ICD-10-CM | POA: Diagnosis not present

## 2022-12-08 DIAGNOSIS — R17 Unspecified jaundice: Secondary | ICD-10-CM | POA: Diagnosis not present

## 2022-12-08 DIAGNOSIS — E782 Mixed hyperlipidemia: Secondary | ICD-10-CM | POA: Diagnosis not present

## 2022-12-21 DIAGNOSIS — R202 Paresthesia of skin: Secondary | ICD-10-CM | POA: Diagnosis not present

## 2022-12-21 DIAGNOSIS — M65332 Trigger finger, left middle finger: Secondary | ICD-10-CM | POA: Diagnosis not present

## 2022-12-21 DIAGNOSIS — R200 Anesthesia of skin: Secondary | ICD-10-CM | POA: Diagnosis not present

## 2022-12-27 ENCOUNTER — Ambulatory Visit (INDEPENDENT_AMBULATORY_CARE_PROVIDER_SITE_OTHER): Payer: Medicare Other | Admitting: Podiatry

## 2022-12-27 ENCOUNTER — Encounter: Payer: Self-pay | Admitting: Podiatry

## 2022-12-27 DIAGNOSIS — G609 Hereditary and idiopathic neuropathy, unspecified: Secondary | ICD-10-CM

## 2022-12-27 NOTE — Progress Notes (Signed)
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 male presents with the above complaint.   ROS: Denies fever chills nausea vomit muscle aches pains calf pain back pain chest pain shortness of breath.  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   Gastroesophageal reflux    Hyperlipidemia    Hypertension    Motion sickness    boats, planes, [address]   Neuropathy    Feet and hands   PONV (postoperative nausea and vomiting)    Pre-diabetes    Seasonal allergies    Sinus infection    Vertigo    none for several years   Past Surgical History:  Procedure Laterality Date   APPENDECTOMY     BACK SURGERY     CATARACT EXTRACTION W/PHACO Right 03/13/2022   Procedure: CATARACT EXTRACTION PHACO AND INTRAOCULAR LENS PLACEMENT (IOC) RIGHT  6.20  00:42.1;  Surgeon: Nevada Crane, MD;  Location: [facility];  Service: Ophthalmology;  Laterality: Right;   CATARACT EXTRACTION W/PHACO Left 03/27/2022   Procedure: CATARACT EXTRACTION PHACO AND INTRAOCULAR LENS PLACEMENT (IOC) LEFT 5.35 00:37.0;  Surgeon: Nevada Crane, MD;  Location: [facility];  Service: Ophthalmology;  Laterality: Left;   TOTAL HIP ARTHROPLASTY Bilateral    TOTAL KNEE ARTHROPLASTY Right 03/07/2019   Procedure: TOTAL KNEE ARTHROPLASTY;  Surgeon: Frederico Hamman, MD;  Location: [facility];  Service: Orthopedics;  Laterality: Right;   VASECTOMY      Current Outpatient Medications:    acetaminophen (TYLENOL) 500 MG tablet, Take 650 mg by mouth every 6 (six) hours as needed (pain. (Janis Cuffe 4-5 TABLETS/24HRS.)). Takes 2 tablets  every 8 hours for pain., Disp: , Rfl:    atorvastatin (LIPITOR) 40 MG tablet, Take 40 mg by mouth daily., Disp: , Rfl:    fluticasone (FLONASE) 50 MCG/ACT nasal spray, Place 1 spray into both nostrils daily as needed., Disp: , Rfl:    gabapentin (NEURONTIN) 100 MG capsule, Take 100 mg by mouth daily., Disp: , Rfl:    loratadine (CLARITIN) 10 MG tablet, Take 10 mg by mouth at bedtime. , Disp: , Rfl:    losartan (COZAAR) 50 MG tablet, Take 50 mg by mouth at bedtime. , Disp: , Rfl:    meloxicam (MOBIC) 15 MG tablet, Take 15 mg by mouth at bedtime., Disp: , Rfl:    omeprazole (PRILOSEC) 20 MG capsule, Take 20 mg by mouth every Monday, Tuesday, Wednesday, Thursday, and Friday. Before supper, Disp: , Rfl:   Allergies  Allergen Reactions   Penicillins Hives and Swelling    Did it involve swelling of the face/tongue/throat, SOB, or low BP? Unknown Did it involve sudden or severe rash/hives, skin peeling, or any reaction on the inside of your mouth or nose? Unknown Did you need to seek medical attention at a hospital or doctor's office? Unknown When did it last happen? childhood reaction     If all above answers are "NO", may proceed with cephalosporin use.    Sulfa Antibiotics Hives and Swelling   Adhesive [  Tape] Hives   Cefdinir Diarrhea    Other reaction(s): Other (See Comments) Dehydration   Gabapentin Other (See Comments)    Dizziness (Pt ok with lower dose)   Pregabalin Diarrhea   Sulfacetamide Sodium-Sulfur    Review of Systems Objective:  There were no vitals filed for this visit.  General: Well developed, nourished, in no acute distress, alert and oriented x3   Dermatological: Skin is warm, dry and supple bilateral. Nails x 10 are well maintained; remaining integument appears unremarkable at this time. There are no open sores, no preulcerative lesions, no rash or signs of infection present.  Vascular: Dorsalis Pedis artery and Posterior Tibial artery pedal pulses are 2/4 bilateral  with immedate capillary fill time. Pedal hair growth present. No varicosities and no lower extremity edema present bilateral.   Neruologic: Grossly intact via light touch bilateral. Vibratory diminished via tuning fork bilateral. Protective threshold with Semmes Wienstein monofilament intact to all pedal sites bilateral. Patellar and Achilles deep tendon reflexes 2+ bilateral. No Babinski or clonus noted bilateral.   Musculoskeletal: No gross boney pedal deformities bilateral. No pain, crepitus, or limitation noted with foot and ankle range of motion bilateral. Muscular strength 5/5 in all groups tested bilateral.  Gait: Unassisted, Nonantalgic.    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  Protective sensation is intact.  Plan: Discussed the pros and cons of neuropathy with him discussed the fact that this could be progressive.  I did discuss proprioceptive type neuropathy such as vibratory neuropathy and positional.  Placed in a compression anklet just to help maintain some proprioceptive contact so that he will refrain from falling.  Questions or concerns he will notify us immediately I will follow-up with him on as-needed basis     Chany Woolworth T. [place], [place]

## 2023-01-22 DIAGNOSIS — U071 COVID-19: Secondary | ICD-10-CM | POA: Diagnosis not present

## 2023-01-23 ENCOUNTER — Telehealth: Payer: Self-pay | Admitting: Internal Medicine

## 2023-01-23 NOTE — Telephone Encounter (Signed)
Copied from CRM [id number]. Topic: Appointment Scheduling - New Patient >> Jan 23, 2023  9:45 AM Lawrence White wrote: Patient was scheduled for a new patient appointment with Dr Judithann Graves on 01/26/2023, for this Friday. Patient called and states he was diagnosed with Covid and is on Paxlovid medication and wants to reschedule this appointment.   Per [facility] rules/One Note, states Dr Judithann Graves is not accepting new patients.  Can this appointment be rescheduled with Dr Judithann Graves?  Patient states he wanted his care to be with Dr Judithann Graves.  Please advise and contact patient back.  Patients callback # [phone]

## 2023-01-23 NOTE — Telephone Encounter (Signed)
Rescheduled to Jan 15th. Okay to do so per Dr Judithann Graves. Patient aware she is retiring and will move over to the next internal medicine doctor when she retires.  - Robt Okuda

## 2023-01-26 ENCOUNTER — Ambulatory Visit: Payer: Self-pay | Admitting: Internal Medicine

## 2023-03-01 DIAGNOSIS — G5603 Carpal tunnel syndrome, bilateral upper limbs: Secondary | ICD-10-CM | POA: Diagnosis not present

## 2023-03-07 ENCOUNTER — Encounter: Payer: Self-pay | Admitting: Internal Medicine

## 2023-03-07 ENCOUNTER — Ambulatory Visit (INDEPENDENT_AMBULATORY_CARE_PROVIDER_SITE_OTHER): Payer: Medicare Other | Admitting: Internal Medicine

## 2023-03-07 VITALS — BP 118/70 | HR 97 | Ht 72.0 in | Wt 212.0 lb

## 2023-03-07 DIAGNOSIS — R7303 Prediabetes: Secondary | ICD-10-CM | POA: Diagnosis not present

## 2023-03-07 DIAGNOSIS — M4317 Spondylolisthesis, lumbosacral region: Secondary | ICD-10-CM

## 2023-03-07 DIAGNOSIS — I10 Essential (primary) hypertension: Secondary | ICD-10-CM | POA: Diagnosis not present

## 2023-03-07 DIAGNOSIS — K219 Gastro-esophageal reflux disease without esophagitis: Secondary | ICD-10-CM

## 2023-03-07 DIAGNOSIS — K635 Polyp of colon: Secondary | ICD-10-CM | POA: Insufficient documentation

## 2023-03-07 DIAGNOSIS — E782 Mixed hyperlipidemia: Secondary | ICD-10-CM | POA: Diagnosis not present

## 2023-03-07 DIAGNOSIS — G5603 Carpal tunnel syndrome, bilateral upper limbs: Secondary | ICD-10-CM

## 2023-03-07 DIAGNOSIS — M150 Primary generalized (osteo)arthritis: Secondary | ICD-10-CM | POA: Diagnosis not present

## 2023-03-07 NOTE — Assessment & Plan Note (Signed)
 Reflux symptoms are minimal on current therapy - omeprazole . No red flag signs such as weight loss, n/v, melena. He has recurrent dysphagia but no history of stricture

## 2023-03-07 NOTE — Assessment & Plan Note (Signed)
 Controlled BP with normal exam. Current regimen is losartan. Will continue same medications; encourage continued reduced sodium diet.

## 2023-03-07 NOTE — Assessment & Plan Note (Signed)
 Being treated by [facility] - dx recently confirmed by [facility] Has follow up to discuss treatment options in the near future

## 2023-03-07 NOTE — Assessment & Plan Note (Signed)
 S/p multiple surgeries and fusions Has some residual pain and numbness in both feet for which he takes gabapentin  200 mg hs

## 2023-03-07 NOTE — Assessment & Plan Note (Signed)
 LDL is  Lab Results  Component Value Date   LDLCALC 69 12/05/2022   Current regimen is atorvastatin .  Tolerating medications well without issues.

## 2023-03-07 NOTE — Progress Notes (Signed)
 Date:  03/07/2023   Name:  Lawrence White   DOB:  08-May-1950   MRN:  [id number]   Chief Complaint: New Patient (Initial Visit) Here to get established.  Moved from [place] 3 yrs ago and decided to get a PCP close to home.  Hypertension This is a chronic problem. The problem is controlled. Pertinent negatives include no chest pain, headaches, palpitations or shortness of breath. There is no history of kidney disease, CAD/MI or CVA.  Hyperlipidemia This is a chronic problem. The problem is controlled. Pertinent negatives include no chest pain or shortness of breath. Current antihyperlipidemic treatment includes statins. The current treatment provides significant improvement of lipids.  Diabetes He presents for his follow-up diabetic visit. Diabetes type: prediabetes. His disease course has been improving. Pertinent negatives for hypoglycemia include no dizziness, headaches or nervousness/anxiousness. Seizures: in feet from back surgery.Associated symptoms include foot paresthesias (from back surgeries). Pertinent negatives for diabetes include no chest pain, no fatigue and no weakness. Pertinent negatives for diabetic complications include no CVA. Current diabetic treatment includes diet. He is compliant with treatment most of the time. Weight trend: has lost 5 lbs. An ACE inhibitor/angiotensin II receptor blocker is being taken.    Review of Systems  Constitutional:  Negative for chills, fatigue and unexpected weight change.  HENT:  Negative for nosebleeds and trouble swallowing.   Eyes:  Negative for visual disturbance.  Respiratory:  Negative for cough, chest tightness, shortness of breath and wheezing.   Cardiovascular:  Negative for chest pain, palpitations and leg swelling.  Gastrointestinal:  Negative for abdominal pain, constipation and diarrhea.       Recurrent gerd and dysphagia when off of PPI   Genitourinary:  Negative for dysuria, frequency and urgency.  Musculoskeletal:   Positive for arthralgias. Negative for joint swelling.  Neurological:  Positive for numbness. Negative for dizziness, weakness, light-headedness and headaches. Seizures: in feet from back surgery. Psychiatric/Behavioral:  Negative for dysphoric mood and sleep disturbance. The patient is not nervous/anxious.      Lab Results  Component Value Date   NA 141 12/05/2022   K 4.3 12/05/2022   CO2 24 (A) 12/05/2022   GLUCOSE 119 (H) 03/03/2019   BUN 16 12/05/2022   CREATININE 0.9 12/05/2022   CALCIUM  9.6 12/05/2022   EGFR 90 12/05/2022   GFRNONAA >60 03/03/2019   Lab Results  Component Value Date   CHOL 130 12/05/2022   HDL 40 12/05/2022   LDLCALC 69 12/05/2022   TRIG 113 12/05/2022   No results found for: "TSH" Lab Results  Component Value Date   HGBA1C 6.1 12/05/2022   Lab Results  Component Value Date   WBC 7.8 12/05/2022   HGB 14.5 12/05/2022   HCT 44 12/05/2022   MCV 88.6 03/03/2019   PLT 236 12/05/2022   Lab Results  Component Value Date   ALT 30 12/05/2022   AST 32 12/05/2022   ALKPHOS 67 12/05/2022   BILITOT 1.3 (H) 03/03/2019   Lab Results  Component Value Date   VD25OH 53.1 12/05/2022     Patient Active Problem List   Diagnosis Date Noted   Prediabetes 03/07/2023   Colon polyps    Bilateral carpal tunnel syndrome 12/08/2022   Gastroesophageal reflux disease without esophagitis 12/02/2020   Bone spur of left foot 12/02/2020   Neuropathy 12/02/2020   Osteoarthritis, multiple sites 12/02/2020   Vitamin D  deficiency 12/02/2020   Essential hypertension 11/30/2020   Mixed hyperlipidemia 11/30/2020   Seasonal allergic rhinitis  11/30/2020   Cervical spondylosis 05/07/2018   Spondylolisthesis of lumbosacral region 05/07/2018    Allergies  Allergen Reactions   Penicillins Hives and Swelling    Did it involve swelling of the face/tongue/throat, SOB, or low BP? Unknown Did it involve sudden or severe rash/hives, skin peeling, or any reaction on the inside  of your mouth or nose? Unknown Did you need to seek medical attention at a hospital or doctor's office? Unknown When did it last happen? childhood reaction     If all above answers are "NO", may proceed with cephalosporin use.    Sulfa Antibiotics Hives and Swelling   Adhesive [Tape] Hives   Cefdinir Diarrhea    Other reaction(s): Other (See Comments) Dehydration   Pregabalin Diarrhea    Past Surgical History:  Procedure Laterality Date   APPENDECTOMY  1971   BACK SURGERY     X5- 3 were fusions, 2 were bon spur removals, and releiving pressure on nerves   CATARACT EXTRACTION W/PHACO Right 03/13/2022   Procedure: CATARACT EXTRACTION PHACO AND INTRAOCULAR LENS PLACEMENT (IOC) RIGHT  6.20  00:42.1;  Surgeon: Rosa College, MD;  Location: [facility];  Service: Ophthalmology;  Laterality: Right;   CATARACT EXTRACTION W/PHACO Left 03/27/2022   Procedure: CATARACT EXTRACTION PHACO AND INTRAOCULAR LENS PLACEMENT (IOC) LEFT 5.35 00:37.0;  Surgeon: Rosa College, MD;  Location: [facility];  Service: Ophthalmology;  Laterality: Left;   TOTAL HIP ARTHROPLASTY Bilateral    Right in 2021, Left in 2022   TOTAL KNEE ARTHROPLASTY Right 03/07/2019   Procedure: TOTAL KNEE ARTHROPLASTY;  Surgeon: Marlena Sima, MD;  Location: [facility];  Service: Orthopedics;  Laterality: Right;   VASECTOMY      Social History   Tobacco Use   Smoking status: Former    Current packs/day: 0.00    Average packs/day: 1.5 packs/day for 19.0 years (28.5 ttl pk-yrs)    Types: Cigarettes    Start date: 01/29/1955    Quit date: 01/28/1974    Years since quitting: 49.1    Passive exposure: Past   Smokeless tobacco: Former    Types: Chew    Quit date: 12/02/1987  Vaping Use   Vaping status: Never Used  Substance Use Topics   Alcohol use: No   Drug use: Never     Medication list has been reviewed and updated.  Current Meds  Medication Sig   acetaminophen  (TYLENOL ) 500 MG tablet Take  650 mg by mouth every 6 (six) hours as needed (pain. (MAX 4-5 TABLETS/24HRS.)). Takes 2 tablets every 8 hours for pain.   aspirin  EC 81 MG tablet Take 81 mg by mouth daily. Swallow whole.   atorvastatin  (LIPITOR) 40 MG tablet Take 40 mg by mouth daily.   Cholecalciferol (VITAMIN D3) 50 MCG (2000 UT) capsule Take 2,000 Units by mouth daily.   fluticasone (FLONASE) 50 MCG/ACT nasal spray Place 1 spray into both nostrils daily as needed.   gabapentin  (NEURONTIN ) 100 MG capsule Take 200 mg by mouth at bedtime.   loratadine (CLARITIN) 10 MG tablet Take 10 mg by mouth at bedtime.    losartan  (COZAAR ) 100 MG tablet Take 100 mg by mouth at bedtime.   Melatonin 1 MG CAPS Take by mouth.   meloxicam  (MOBIC ) 15 MG tablet Take 15 mg by mouth at bedtime.   omeprazole  (PRILOSEC) 20 MG capsule Take 20 mg by mouth every Monday, Tuesday, Wednesday, Thursday, and Friday. Before supper       03/07/2023  3:52 PM  GAD 7 : Generalized Anxiety Score  Nervous, Anxious, on Edge 0  Control/stop worrying 0  Worry too much - different things 0  Trouble relaxing 0  Restless 0  Easily annoyed or irritable 0  Afraid - awful might happen 0  Total GAD 7 Score 0  Anxiety Difficulty Not difficult at all       03/07/2023    3:52 PM  Depression screen PHQ 2/9  Decreased Interest 0  Down, Depressed, Hopeless 0  PHQ - 2 Score 0  Altered sleeping 0  Tired, decreased energy 0  Change in appetite 0  Feeling bad or failure about yourself  0  Trouble concentrating 0  Moving slowly or fidgety/restless 0  Suicidal thoughts 0  PHQ-9 Score 0  Difficult doing work/chores Not difficult at all    BP Readings from Last 3 Encounters:  03/07/23 118/70  03/27/22 115/83  03/13/22 117/77    Physical Exam Vitals and nursing note reviewed.  Constitutional:      General: He is not in acute distress.    Appearance: He is well-developed.  HENT:     Head: Normocephalic and atraumatic.  Neck:     Vascular: No carotid  bruit.  Cardiovascular:     Rate and Rhythm: Normal rate and regular rhythm.  Pulmonary:     Effort: Pulmonary effort is normal. No respiratory distress.     Breath sounds: No wheezing or rhonchi.  Musculoskeletal:     Cervical back: Normal range of motion.     Right lower leg: No edema.     Left lower leg: No edema.  Lymphadenopathy:     Cervical: No cervical adenopathy.  Skin:    General: Skin is warm and dry.     Findings: No rash.  Neurological:     General: No focal deficit present.     Mental Status: He is alert and oriented to person, place, and time.  Psychiatric:        Mood and Affect: Mood normal.        Behavior: Behavior normal.     Wt Readings from Last 3 Encounters:  03/07/23 212 lb (96.2 kg)  03/27/22 217 lb (98.4 kg)  03/13/22 215 lb 4.8 oz (97.7 kg)    BP 118/70   Pulse 97   Ht 6' (1.829 m)   Wt 212 lb (96.2 kg)   SpO2 97%   BMI 28.75 kg/m   Assessment and Plan:  Problem List Items Addressed This Visit       Unprioritized   Spondylolisthesis of lumbosacral region (Chronic)   S/p multiple surgeries and fusions Has some residual pain and numbness in both feet for which he takes gabapentin  200 mg hs      Gastroesophageal reflux disease without esophagitis (Chronic)   Reflux symptoms are minimal on current therapy - omeprazole . No red flag signs such as weight loss, n/v, melena. He has recurrent dysphagia but no history of stricture       Essential hypertension - Primary (Chronic)   Controlled BP with normal exam. Current regimen is losartan . Will continue same medications; encourage continued reduced sodium diet.       Relevant Medications   aspirin  EC 81 MG tablet   Mixed hyperlipidemia (Chronic)   LDL is  Lab Results  Component Value Date   LDLCALC 69 12/05/2022   Current regimen is atorvastatin .  Tolerating medications well without issues.       Relevant Medications   aspirin   EC 81 MG tablet   Bilateral carpal tunnel  syndrome (Chronic)   Being treated by [facility] - dx recently confirmed by [facility] Has follow up to discuss treatment options in the near future      Prediabetes (Chronic)   Managed with diet only - cut out soda and sweet tea. Lab Results  Component Value Date   HGBA1C 6.1 12/05/2022         Osteoarthritis, multiple sites   Hands, knees, shoulders Takes Mobic  daily and Tylenol  XS 2-3 times per day      Relevant Medications   aspirin  EC 81 MG tablet    Return in about 3 months (around 06/05/2023) for HTN.    Sheron Dixons, MD [facility] [place]

## 2023-03-07 NOTE — Assessment & Plan Note (Signed)
 Hands, knees, shoulders Takes Mobic  daily and Tylenol  XS 2-3 times per day

## 2023-03-07 NOTE — Assessment & Plan Note (Signed)
 Managed with diet only - cut out soda and sweet tea. Lab Results  Component Value Date   HGBA1C 6.1 12/05/2022

## 2023-03-08 DIAGNOSIS — G5603 Carpal tunnel syndrome, bilateral upper limbs: Secondary | ICD-10-CM | POA: Diagnosis not present

## 2023-03-22 ENCOUNTER — Other Ambulatory Visit: Payer: Self-pay

## 2023-03-22 ENCOUNTER — Encounter: Payer: Self-pay | Admitting: Internal Medicine

## 2023-03-22 MED ORDER — OMEPRAZOLE 20 MG PO CPDR: 20.0000 mg | DELAYED_RELEASE_CAPSULE | Freq: Every day | ORAL | 1 refills | Status: DC

## 2023-03-22 MED ORDER — MELOXICAM 15 MG PO TABS: 15.0000 mg | ORAL_TABLET | Freq: Every day | ORAL | 1 refills | Status: DC

## 2023-04-21 HISTORY — PX: CARPAL TUNNEL RELEASE: SHX101

## 2023-04-23 ENCOUNTER — Encounter: Payer: Self-pay | Admitting: Internal Medicine

## 2023-04-23 ENCOUNTER — Other Ambulatory Visit: Payer: Self-pay | Admitting: Internal Medicine

## 2023-04-23 DIAGNOSIS — E782 Mixed hyperlipidemia: Secondary | ICD-10-CM

## 2023-04-23 DIAGNOSIS — M4317 Spondylolisthesis, lumbosacral region: Secondary | ICD-10-CM

## 2023-04-23 DIAGNOSIS — I10 Essential (primary) hypertension: Secondary | ICD-10-CM

## 2023-04-23 MED ORDER — LOSARTAN POTASSIUM 100 MG PO TABS: 100.0000 mg | ORAL_TABLET | Freq: Every day | ORAL | 1 refills | Status: DC

## 2023-04-23 MED ORDER — GABAPENTIN 100 MG PO CAPS: 100.0000 mg | ORAL_CAPSULE | Freq: Two times a day (BID) | ORAL | 1 refills | Status: DC

## 2023-04-23 MED ORDER — ATORVASTATIN CALCIUM 40 MG PO TABS: 40.0000 mg | ORAL_TABLET | Freq: Every day | ORAL | 1 refills | Status: DC

## 2023-04-24 DIAGNOSIS — E119 Type 2 diabetes mellitus without complications: Secondary | ICD-10-CM | POA: Diagnosis not present

## 2023-04-24 DIAGNOSIS — Z961 Presence of intraocular lens: Secondary | ICD-10-CM | POA: Diagnosis not present

## 2023-04-24 LAB — HM DIABETES EYE EXAM

## 2023-04-25 DIAGNOSIS — G5601 Carpal tunnel syndrome, right upper limb: Secondary | ICD-10-CM | POA: Diagnosis not present

## 2023-04-27 NOTE — Telephone Encounter (Signed)
 Please review.  KP

## 2023-04-30 NOTE — Telephone Encounter (Unsigned)
 Copied from CRM [id number]. Topic: Clinical - Prescription Issue >> Apr 30, 2023  2:18 PM Shelah Lewandowsky wrote: Reason for CRM: gabapentin (NEURONTIN) 100 MG capsule- Luke with [facility] verifying patient is not allergic to this medication

## 2023-05-10 DIAGNOSIS — G5603 Carpal tunnel syndrome, bilateral upper limbs: Secondary | ICD-10-CM | POA: Diagnosis not present

## 2023-06-05 ENCOUNTER — Ambulatory Visit (INDEPENDENT_AMBULATORY_CARE_PROVIDER_SITE_OTHER): Payer: Self-pay | Admitting: Internal Medicine

## 2023-06-05 ENCOUNTER — Encounter: Payer: Self-pay | Admitting: Internal Medicine

## 2023-06-05 VITALS — BP 132/84 | HR 60 | Ht 72.0 in | Wt 208.5 lb

## 2023-06-05 DIAGNOSIS — I10 Essential (primary) hypertension: Secondary | ICD-10-CM

## 2023-06-05 DIAGNOSIS — Z23 Encounter for immunization: Secondary | ICD-10-CM

## 2023-06-05 DIAGNOSIS — G5603 Carpal tunnel syndrome, bilateral upper limbs: Secondary | ICD-10-CM | POA: Diagnosis not present

## 2023-06-05 DIAGNOSIS — Z0289 Encounter for other administrative examinations: Secondary | ICD-10-CM | POA: Diagnosis not present

## 2023-06-05 DIAGNOSIS — R7303 Prediabetes: Secondary | ICD-10-CM | POA: Diagnosis not present

## 2023-06-05 MED ORDER — SHINGRIX 50 MCG/0.5ML IM SUSR: 0.5000 mL | Freq: Once | INTRAMUSCULAR | 1 refills | Status: AC

## 2023-06-05 NOTE — Assessment & Plan Note (Signed)
 Doing well s/p surgery on right wrist. Left wrist planned for August.

## 2023-06-05 NOTE — Assessment & Plan Note (Signed)
 He has cut out sweet tea and has lost some weight. Will continue to limit sweets.  Check A1C at next visit

## 2023-06-05 NOTE — Progress Notes (Signed)
 Date:  06/05/2023   Name:  Lawrence White   DOB:  30-Mar-1950   MRN:  [id number]   Chief Complaint: Hypertension  Hypertension This is a chronic problem. The problem is controlled. Pertinent negatives include no headaches or shortness of breath. Past treatments include angiotensin blockers. The current treatment provides significant improvement. There is no history of kidney disease, CAD/MI or CVA.  Diabetes He presents for his follow-up diabetic visit. Diabetes type: prediabetes. There are no hypoglycemic associated symptoms. Pertinent negatives for hypoglycemia include no dizziness, headaches or nervousness/anxiousness. Pertinent negatives for diabetes include no fatigue. Pertinent negatives for diabetic complications include no CVA. Current diabetic treatment includes diet. His weight is decreasing steadily.  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   Review of Systems  Constitutional:  Negative for chills, fatigue and fever.  HENT:  Negative for hearing loss and trouble swallowing.   Eyes:  Negative for visual disturbance.  Respiratory:  Negative for chest tightness and shortness of breath.   Gastrointestinal:  Negative for constipation and diarrhea.  Musculoskeletal:  Positive for back pain. Negative for arthralgias and joint swelling.  Neurological:  Negative for dizziness, light-headedness and headaches.  Psychiatric/Behavioral:  Negative for dysphoric mood and sleep disturbance. The patient is not nervous/anxious.      Lab Results  Component Value Date   NA 141 12/05/2022   K 4.3 12/05/2022   CO2 24 (A) 12/05/2022   GLUCOSE 119 (H) 03/03/2019   BUN 16 12/05/2022   CREATININE 0.9 12/05/2022   CALCIUM 9.6 12/05/2022   EGFR 90 12/05/2022   GFRNONAA >60 03/03/2019   Lab Results  Component Value Date   CHOL 130 12/05/2022   HDL 40 12/05/2022   LDLCALC 69 12/05/2022   TRIG 113 12/05/2022   No results found for: "TSH" Lab Results   Component Value Date   HGBA1C 6.1 12/05/2022   Lab Results  Component Value Date   WBC 7.8 12/05/2022   HGB 14.5 12/05/2022   HCT 44 12/05/2022   MCV 88.6 03/03/2019   PLT 236 12/05/2022   Lab Results  Component Value Date   ALT 30 12/05/2022   AST 32 12/05/2022   ALKPHOS 67 12/05/2022   BILITOT 1.3 (H) 03/03/2019   Lab Results  Component Value Date   VD25OH 53.1 12/05/2022     Patient Active Problem List   Diagnosis Date Noted   Prediabetes 03/07/2023   Colon polyps    Bilateral carpal tunnel syndrome 12/08/2022   Gastroesophageal reflux disease without esophagitis 12/02/2020   Bone spur of left foot 12/02/2020   Neuropathy 12/02/2020   Osteoarthritis, multiple sites 12/02/2020   Vitamin D deficiency 12/02/2020   Essential hypertension 11/30/2020   Mixed hyperlipidemia 11/30/2020   Seasonal allergic rhinitis 11/30/2020   Cervical spondylosis 05/07/2018   Spondylolisthesis of lumbosacral region 05/07/2018    Allergies  Allergen Reactions   Penicillins Hives and Swelling    Did it involve swelling of the face/tongue/throat, SOB, or low BP? Unknown Did it involve sudden or severe rash/hives, skin peeling, or any reaction on the inside of your mouth or nose? Unknown Did you need to seek medical attention at a hospital or doctor's office? Unknown When did it last happen? childhood reaction     If all above answers are "NO", may proceed with cephalosporin use.    Sulfa Antibiotics Hives and Swelling   Adhesive [Tape] Hives   Cefdinir Diarrhea  Other reaction(s): Other (See Comments) Dehydration   Pregabalin Diarrhea    Past Surgical History:  Procedure Laterality Date   APPENDECTOMY  1971   BACK SURGERY     X5- 3 were fusions, 2 were bon spur removals, and releiving pressure on nerves   CARPAL TUNNEL RELEASE Right 04/2023   CATARACT EXTRACTION W/PHACO Right 03/13/2022   Procedure: CATARACT EXTRACTION PHACO AND INTRAOCULAR LENS PLACEMENT (IOC) RIGHT  6.20   00:42.1;  Surgeon: Nevada Crane, MD;  Location: [facility];  Service: Ophthalmology;  Laterality: Right;   CATARACT EXTRACTION W/PHACO Left 03/27/2022   Procedure: CATARACT EXTRACTION PHACO AND INTRAOCULAR LENS PLACEMENT (IOC) LEFT 5.35 00:37.0;  Surgeon: Nevada Crane, MD;  Location: [facility];  Service: Ophthalmology;  Laterality: Left;   TOTAL HIP ARTHROPLASTY Bilateral    Right in 2021, Left in 2022   TOTAL KNEE ARTHROPLASTY Right 03/07/2019   Procedure: TOTAL KNEE ARTHROPLASTY;  Surgeon: Frederico Hamman, MD;  Location: [facility];  Service: Orthopedics;  Laterality: Right;   VASECTOMY      Social History   Tobacco Use   Smoking status: Former    Current packs/day: 0.00    Average packs/day: 1.5 packs/day for 19.0 years (28.5 ttl pk-yrs)    Types: Cigarettes    Start date: 01/29/1955    Quit date: 01/28/1974    Years since quitting: 49.3    Passive exposure: Past   Smokeless tobacco: Former    Types: Chew    Quit date: 12/02/1987  Vaping Use   Vaping status: Never Used  Substance Use Topics   Alcohol use: No   Drug use: Never     Medication list has been reviewed and updated.  Current Meds  Medication Sig   acetaminophen (TYLENOL) 650 MG CR tablet Take 650 mg by mouth every 8 (eight) hours as needed for pain.   aspirin EC 81 MG tablet Take 81 mg by mouth daily. Swallow whole.   atorvastatin (LIPITOR) 40 MG tablet Take 1 tablet (40 mg total) by mouth daily.   Cholecalciferol (VITAMIN D3) 50 MCG (2000 UT) capsule Take 2,000 Units by mouth daily.   fluticasone (FLONASE) 50 MCG/ACT nasal spray Place 1 spray into both nostrils daily as needed.   gabapentin (NEURONTIN) 100 MG capsule Take 1 capsule (100 mg total) by mouth 2 (two) times daily.   loratadine (CLARITIN) 10 MG tablet Take 10 mg by mouth at bedtime.    losartan (COZAAR) 100 MG tablet Take 1 tablet (100 mg total) by mouth at bedtime.   Melatonin 1 MG CAPS Take by mouth.   meloxicam (MOBIC)  15 MG tablet Take 1 tablet (15 mg total) by mouth at bedtime.   naphazoline-pheniramine (NAPHCON-A) 0.025-0.3 % ophthalmic solution 1 drop 4 (four) times daily as needed for eye irritation.   omeprazole (PRILOSEC) 20 MG capsule Take 1 capsule (20 mg total) by mouth daily. Before supper   Zoster Vaccine Adjuvanted [facility]) injection Inject 0.5 mLs into the muscle once for 1 dose.   [DISCONTINUED] acetaminophen (TYLENOL) 500 MG tablet Take 650 mg by mouth every 6 (six) hours as needed (pain. (MAX 4-5 TABLETS/24HRS.)). Takes 2 tablets every 8 hours for pain.       06/05/2023    1:15 PM 03/07/2023    3:52 PM  GAD 7 : Generalized Anxiety Score  Nervous, Anxious, on Edge 0 0  Control/stop worrying 0 0  Worry too much - different things 0 0  Trouble relaxing 0 0  Restless 0 0  Easily annoyed or irritable 0 0  Afraid - awful might happen 0 0  Total GAD 7 Score 0 0  Anxiety Difficulty Not difficult at all Not difficult at all       06/05/2023    1:15 PM 03/07/2023    3:52 PM  Depression screen PHQ 2/9  Decreased Interest 0 0  Down, Depressed, Hopeless 0 0  PHQ - 2 Score 0 0  Altered sleeping 0 0  Tired, decreased energy 2 0  Change in appetite 0 0  Feeling bad or failure about yourself  0 0  Trouble concentrating 0 0  Moving slowly or fidgety/restless 0 0  Suicidal thoughts 0 0  PHQ-9 Score 2 0  Difficult doing work/chores Not difficult at all Not difficult at all    BP Readings from Last 3 Encounters:  06/05/23 132/84  03/07/23 118/70  03/27/22 115/83    Physical Exam Vitals and nursing note reviewed.  Constitutional:      General: He is not in acute distress.    Appearance: Normal appearance. He is well-developed.  HENT:     Head: Normocephalic and atraumatic.  Cardiovascular:     Rate and Rhythm: Normal rate and regular rhythm.     Heart sounds: No murmur heard. Pulmonary:     Effort: Pulmonary effort is normal. No respiratory distress.     Breath sounds: No  wheezing or rhonchi.  Musculoskeletal:     Cervical back: Normal range of motion.     Right lower leg: Edema present.     Left lower leg: No edema.  Lymphadenopathy:     Cervical: No cervical adenopathy.  Skin:    General: Skin is warm and dry.     Capillary Refill: Capillary refill takes less than 2 seconds.     Findings: No rash.  Neurological:     General: No focal deficit present.     Mental Status: He is alert and oriented to person, place, and time.  Psychiatric:        Mood and Affect: Mood normal.        Behavior: Behavior normal.     Wt Readings from Last 3 Encounters:  06/05/23 208 lb 8 oz (94.6 kg)  03/07/23 212 lb (96.2 kg)  03/27/22 217 lb (98.4 kg)    BP 132/84   Pulse 60   Ht 6' (1.829 m)   Wt 208 lb 8 oz (94.6 kg)   SpO2 96%   BMI 28.28 kg/m   Assessment and Plan:  Problem List Items Addressed This Visit       Unprioritized   Essential hypertension - Primary (Chronic)   Blood pressure is well controlled.  Current medications losartan. Will continue same regimen along with efforts to limit dietary sodium.       Bilateral carpal tunnel syndrome (Chronic)   Doing well s/p surgery on right wrist. Left wrist planned for August.      Prediabetes (Chronic)   He has cut out sweet tea and has lost some weight. Will continue to limit sweets.  Check A1C at next visit      Other Visit Diagnoses       Encounter for camp admission history and physical       Form completed and provided to patient to submit. copy for chart.     Immunization due       Recommend Shingrix vaccines at [facility].   Relevant Medications   Zoster Vaccine Adjuvanted (  [facility]) injection       Return in about 4 months (around 10/05/2023) for CPX.    Sheron Dixons, MD [facility] [place]

## 2023-06-05 NOTE — Assessment & Plan Note (Signed)
 Blood pressure is well controlled.  Current medications losartan Will continue same regimen along with efforts to limit dietary sodium.

## 2023-07-11 DIAGNOSIS — M4800 Spinal stenosis, site unspecified: Secondary | ICD-10-CM | POA: Diagnosis not present

## 2023-07-11 DIAGNOSIS — Z6828 Body mass index (BMI) 28.0-28.9, adult: Secondary | ICD-10-CM | POA: Diagnosis not present

## 2023-08-07 DIAGNOSIS — M5116 Intervertebral disc disorders with radiculopathy, lumbar region: Secondary | ICD-10-CM | POA: Diagnosis not present

## 2023-08-07 DIAGNOSIS — M5416 Radiculopathy, lumbar region: Secondary | ICD-10-CM | POA: Diagnosis not present

## 2023-09-13 DIAGNOSIS — G5603 Carpal tunnel syndrome, bilateral upper limbs: Secondary | ICD-10-CM | POA: Diagnosis not present

## 2023-09-13 DIAGNOSIS — M65311 Trigger thumb, right thumb: Secondary | ICD-10-CM | POA: Diagnosis not present

## 2023-10-03 ENCOUNTER — Other Ambulatory Visit: Payer: Self-pay | Admitting: Internal Medicine

## 2023-10-05 NOTE — Telephone Encounter (Signed)
 Requested Prescriptions  Pending Prescriptions Disp Refills   omeprazole  (PRILOSEC) 20 MG capsule [Pharmacy Med Name: OMEPRAZOLE  DR CAPS 20MG ] 90 capsule 1    Sig: TAKE 1 CAPSULE DAILY BEFORE SUPPER     Gastroenterology: Proton Pump Inhibitors Passed - 10/05/2023  4:12 PM      Passed - Valid encounter within last 12 months    Recent Outpatient Visits           4 months ago Essential hypertension   [place] [facility] at [facility], Leita DEL, MD       Future Appointments             In 4 days Justus Leita DEL, MD [facility] at [facility], [facility]

## 2023-10-09 ENCOUNTER — Ambulatory Visit (INDEPENDENT_AMBULATORY_CARE_PROVIDER_SITE_OTHER): Admitting: Internal Medicine

## 2023-10-09 ENCOUNTER — Ambulatory Visit (INDEPENDENT_AMBULATORY_CARE_PROVIDER_SITE_OTHER)

## 2023-10-09 ENCOUNTER — Encounter: Payer: Self-pay | Admitting: Internal Medicine

## 2023-10-09 VITALS — BP 108/62 | HR 70 | Ht 72.0 in | Wt 199.4 lb

## 2023-10-09 DIAGNOSIS — Z0000 Encounter for general adult medical examination without abnormal findings: Secondary | ICD-10-CM | POA: Diagnosis not present

## 2023-10-09 DIAGNOSIS — R351 Nocturia: Secondary | ICD-10-CM

## 2023-10-09 DIAGNOSIS — Z23 Encounter for immunization: Secondary | ICD-10-CM

## 2023-10-09 DIAGNOSIS — R7303 Prediabetes: Secondary | ICD-10-CM

## 2023-10-09 DIAGNOSIS — M150 Primary generalized (osteo)arthritis: Secondary | ICD-10-CM

## 2023-10-09 DIAGNOSIS — Z1159 Encounter for screening for other viral diseases: Secondary | ICD-10-CM

## 2023-10-09 DIAGNOSIS — G5603 Carpal tunnel syndrome, bilateral upper limbs: Secondary | ICD-10-CM

## 2023-10-09 DIAGNOSIS — K219 Gastro-esophageal reflux disease without esophagitis: Secondary | ICD-10-CM | POA: Diagnosis not present

## 2023-10-09 DIAGNOSIS — E782 Mixed hyperlipidemia: Secondary | ICD-10-CM | POA: Diagnosis not present

## 2023-10-09 DIAGNOSIS — I10 Essential (primary) hypertension: Secondary | ICD-10-CM | POA: Diagnosis not present

## 2023-10-09 NOTE — Assessment & Plan Note (Addendum)
 Managed with diet only.  Recommend high protein/low carb breakfast choices to reduce hypoglycemia. Lab Results  Component Value Date   HGBA1C 6.1 12/05/2022

## 2023-10-09 NOTE — Assessment & Plan Note (Signed)
 He takes Meloxicam  daily to control his discomfort. No evidence of renal impairment noted. Will check CMP today.

## 2023-10-09 NOTE — Progress Notes (Signed)
 Date:  10/09/2023   Name:  Lawrence White   DOB:  [birth year]   MRN:  [id number]   Chief Complaint: Annual Exam Lawrence White is a 73 y.o. male who presents today for his Complete Annual Exam. He feels well. He reports exercising some. He reports he is sleeping well. He has carpal tunnel surgery tomorrow.  Health Maintenance  Topic Date Due   Hepatitis C Screening  Never done   Pneumococcal Vaccine for age over 82 (1 of 2 - PCV) Never done   Zoster (Shingles) Vaccine (1 of 2) Never done   COVID-19 Vaccine (1 - 2024-25 season) Never done   Flu Shot  09/21/2023   Medicare Annual Wellness Visit  10/08/2024   Colon Cancer Screening  10/04/2026   DTaP/Tdap/Td vaccine (2 - Td or Tdap) 12/07/2032   HPV Vaccine  Aged Out   Meningitis B Vaccine  Aged Out    Lab Results  Component Value Date   PSA 0.8 12/05/2022    Hypertension This is a chronic problem. The problem is controlled. Pertinent negatives include no chest pain, headaches, palpitations or shortness of breath. Past treatments include angiotensin blockers.  Hyperlipidemia This is a chronic problem. The problem is controlled. Pertinent negatives include no chest pain or shortness of breath. Current antihyperlipidemic treatment includes statins. The current treatment provides significant improvement of lipids.  Diabetes He presents for his follow-up diabetic visit. Diabetes type: prediabetes. His disease course has been stable. Pertinent negatives for hypoglycemia include no dizziness, headaches or nervousness/anxiousness. (Occasional low blood sugar sx after eating a high carb breakfast) Pertinent negatives for diabetes include no chest pain, no fatigue and no weakness.    Review of Systems  Constitutional:  Negative for fatigue and unexpected weight change.  HENT:  Positive for trouble swallowing (mild dysphagia intermittently - not worsening). Negative for nosebleeds.   Eyes:  Negative for visual disturbance.  Respiratory:   Negative for cough, chest tightness, shortness of breath and wheezing.   Cardiovascular:  Negative for chest pain, palpitations and leg swelling.  Gastrointestinal:  Negative for abdominal pain, constipation and diarrhea.  Genitourinary:  Negative for frequency and urgency.       Nocturia  Musculoskeletal:  Positive for arthralgias, back pain and joint swelling.  Skin:  Negative for rash.  Neurological:  Negative for dizziness, weakness, light-headedness and headaches.  Psychiatric/Behavioral:  Negative for dysphoric mood and sleep disturbance. The patient is not nervous/anxious.      Lab Results  Component Value Date   NA 141 12/05/2022   K 4.3 12/05/2022   CO2 24 (A) 12/05/2022   GLUCOSE 119 (H) 03/03/2019   BUN 16 12/05/2022   CREATININE 0.9 12/05/2022   CALCIUM  9.6 12/05/2022   EGFR 90 12/05/2022   GFRNONAA >60 03/03/2019   Lab Results  Component Value Date   CHOL 130 12/05/2022   HDL 40 12/05/2022   LDLCALC 69 12/05/2022   TRIG 113 12/05/2022   No results found for: TSH Lab Results  Component Value Date   HGBA1C 6.1 12/05/2022   Lab Results  Component Value Date   WBC 7.8 12/05/2022   HGB 14.5 12/05/2022   HCT 44 12/05/2022   MCV 88.6 03/03/2019   PLT 236 12/05/2022   Lab Results  Component Value Date   ALT 30 12/05/2022   AST 32 12/05/2022   ALKPHOS 67 12/05/2022   BILITOT 1.3 (H) 03/03/2019   Lab Results  Component Value Date   VD25OH 102.1  12/05/2022     Patient Active Problem List   Diagnosis Date Noted   Prediabetes 03/07/2023   Colon polyps    Bilateral carpal tunnel syndrome 12/08/2022   Gastroesophageal reflux disease without esophagitis 12/02/2020   Bone spur of left foot 12/02/2020   Neuropathy 12/02/2020   Osteoarthritis, multiple sites 12/02/2020   Vitamin D  deficiency 12/02/2020   Essential hypertension 11/30/2020   Mixed hyperlipidemia 11/30/2020   Seasonal allergic rhinitis 11/30/2020   Cervical spondylosis 05/07/2018    Spondylolisthesis of lumbosacral region 05/07/2018    Allergies  Allergen Reactions   Penicillins Hives and Swelling    Did it involve swelling of the face/tongue/throat, SOB, or low BP? Unknown Did it involve sudden or severe rash/hives, skin peeling, or any reaction on the inside of your mouth or nose? Unknown Did you need to seek medical attention at a hospital or doctor's office? Unknown When did it last happen? childhood reaction     If all above answers are "NO", may proceed with cephalosporin use.    Sulfa Antibiotics Hives and Swelling   Adhesive [Tape] Hives   Cefdinir Diarrhea    Other reaction(s): Other (See Comments) Dehydration   Pregabalin Diarrhea    Past Surgical History:  Procedure Laterality Date   APPENDECTOMY  1971   BACK SURGERY     X5- 3 were fusions, 2 were bon spur removals, and releiving pressure on nerves   CARPAL TUNNEL RELEASE Right 04/2023   CATARACT EXTRACTION W/PHACO Right 03/13/2022   Procedure: CATARACT EXTRACTION PHACO AND INTRAOCULAR LENS PLACEMENT (IOC) RIGHT  6.20  00:42.1;  Surgeon: Myrna Adine Anes, MD;  Location: [facility];  Service: Ophthalmology;  Laterality: Right;   CATARACT EXTRACTION W/PHACO Left 03/27/2022   Procedure: CATARACT EXTRACTION PHACO AND INTRAOCULAR LENS PLACEMENT (IOC) LEFT 5.35 00:37.0;  Surgeon: Myrna Adine Anes, MD;  Location: [facility];  Service: Ophthalmology;  Laterality: Left;   EYE SURGERY  2024   Cateracts removed both eyes   JOINT REPLACEMENT  2020 Rt Knee-2022 Rt Hip-2023 Lt Hip   SPINE SURGERY  2017   5 surgeries L4-S1 beginning mid-80's last 2017   TOTAL HIP ARTHROPLASTY Bilateral    Right in 2021, Left in 2022   TOTAL KNEE ARTHROPLASTY Right 03/07/2019   Procedure: TOTAL KNEE ARTHROPLASTY;  Surgeon: Shari Sieving, MD;  Location: [facility];  Service: Orthopedics;  Laterality: Right;   VASECTOMY      Social History   Tobacco Use   Smoking status: Former    Current packs/day:  0.00    Average packs/day: 1.5 packs/day for 19.0 years (28.5 ttl pk-yrs)    Types: Cigarettes, Pipe, Cigars    Start date: 01/29/1955    Quit date: 01/28/1974    Years since quitting: 49.7    Passive exposure: Past   Smokeless tobacco: Former    Types: Chew    Quit date: 12/02/1987  Vaping Use   Vaping status: Never Used  Substance Use Topics   Alcohol use: Not Currently   Drug use: Never     Medication list has been reviewed and updated.  Current Meds  Medication Sig   atorvastatin  (LIPITOR) 40 MG tablet Take 1 tablet (40 mg total) by mouth daily.   Cholecalciferol (VITAMIN D3) 50 MCG (2000 UT) capsule Take 2,000 Units by mouth daily.   fluticasone (FLONASE) 50 MCG/ACT nasal spray Place 1 spray into both nostrils daily as needed.   gabapentin  (NEURONTIN ) 100 MG capsule Take 1 capsule (100 mg total)  by mouth 2 (two) times daily.   loratadine (CLARITIN) 10 MG tablet Take 10 mg by mouth at bedtime.    losartan  (COZAAR ) 100 MG tablet Take 1 tablet (100 mg total) by mouth at bedtime.   Melatonin 1 MG CAPS Take by mouth.   meloxicam  (MOBIC ) 15 MG tablet Take 1 tablet (15 mg total) by mouth at bedtime.   omeprazole  (PRILOSEC) 20 MG capsule TAKE 1 CAPSULE DAILY BEFORE SUPPER   [DISCONTINUED] acetaminophen  (TYLENOL ) 650 MG CR tablet Take 650 mg by mouth every 8 (eight) hours as needed for pain.   [DISCONTINUED] aspirin  EC 81 MG tablet Take 81 mg by mouth daily. Swallow whole.   [DISCONTINUED] naphazoline-pheniramine (NAPHCON-A) 0.025-0.3 % ophthalmic solution 1 drop 4 (four) times daily as needed for eye irritation.       10/09/2023    9:18 AM 06/05/2023    1:15 PM 03/07/2023    3:52 PM  GAD 7 : Generalized Anxiety Score  Nervous, Anxious, on Edge 0 0 0  Control/stop worrying 0 0 0  Worry too much - different things 0 0 0  Trouble relaxing 0 0 0  Restless 0 0 0  Easily annoyed or irritable 0 0 0  Afraid - awful might happen 0 0 0  Total GAD 7 Score 0 0 0  Anxiety Difficulty Not  difficult at all Not difficult at all Not difficult at all       10/09/2023    9:17 AM 06/05/2023    1:15 PM 03/07/2023    3:52 PM  Depression screen PHQ 2/9  Decreased Interest 0 0 0  Down, Depressed, Hopeless 0 0 0  PHQ - 2 Score 0 0 0  Altered sleeping 0 0 0  Tired, decreased energy 3 2 0  Change in appetite 0 0 0  Feeling bad or failure about yourself  0 0 0  Trouble concentrating 0 0 0  Moving slowly or fidgety/restless 0 0 0  Suicidal thoughts 0 0 0  PHQ-9 Score 3 2 0  Difficult doing work/chores Very difficult Not difficult at all Not difficult at all    BP Readings from Last 3 Encounters:  10/09/23 108/62  10/09/23 108/62  06/05/23 132/84    Physical Exam Vitals and nursing note reviewed.  Constitutional:      Appearance: Normal appearance. He is well-developed.  HENT:     Head: Normocephalic.     Right Ear: Tympanic membrane, ear canal and external ear normal.     Left Ear: Tympanic membrane, ear canal and external ear normal.     Nose: Nose normal.  Eyes:     Conjunctiva/sclera: Conjunctivae normal.     Pupils: Pupils are equal, round, and reactive to light.  Neck:     Thyroid : No thyromegaly.     Vascular: No carotid bruit.  Cardiovascular:     Rate and Rhythm: Normal rate and regular rhythm.     Heart sounds: Normal heart sounds.  Pulmonary:     Effort: Pulmonary effort is normal.     Breath sounds: Normal breath sounds. No wheezing.  Chest:  Breasts:    Right: No mass.     Left: No mass.  Abdominal:     General: Bowel sounds are normal.     Palpations: Abdomen is soft.     Tenderness: There is no abdominal tenderness.  Musculoskeletal:        General: Swelling present.     Right wrist: Swelling and bony tenderness present.  Decreased range of motion.     Cervical back: Normal range of motion and neck supple.     Right lower leg: No edema.     Left lower leg: No edema.  Lymphadenopathy:     Cervical: No cervical adenopathy.  Skin:    General:  Skin is warm and dry.     Capillary Refill: Capillary refill takes less than 2 seconds.     Findings: No rash.  Neurological:     General: No focal deficit present.     Mental Status: He is alert and oriented to person, place, and time.     Deep Tendon Reflexes: Reflexes are normal and symmetric.  Psychiatric:        Attention and Perception: Attention normal.        Mood and Affect: Mood normal.        Thought Content: Thought content normal.     Wt Readings from Last 3 Encounters:  10/09/23 199 lb 6.4 oz (90.4 kg)  10/09/23 199 lb 6.4 oz (90.4 kg)  06/05/23 208 lb 8 oz (94.6 kg)    BP 108/62   Pulse 70   Ht 6' (1.829 m)   Wt 199 lb 6.4 oz (90.4 kg)   SpO2 99%   BMI 27.04 kg/m   Assessment and Plan:  Problem List Items Addressed This Visit       Unprioritized   Gastroesophageal reflux disease without esophagitis (Chronic)   Reflux symptoms are controlled on omeprazole .  He has been unable to reduce the dose due to dysphagia. Patient denies red flag symptoms - no melena, weight loss, dysphagia.       Relevant Orders   CBC with Differential/Platelet   Essential hypertension - Primary (Chronic)   Blood pressure is well controlled on Losartan . No medication side effects noted. Plan to continue current medications.       Relevant Orders   CBC with Differential/Platelet   Comprehensive metabolic panel with GFR   TSH   Urinalysis, Routine w reflex microscopic   Mixed hyperlipidemia (Chronic)   Currently on atorvastatin  without side effects. Lab Results  Component Value Date   LDLCALC 69 12/05/2022         Relevant Orders   Lipid panel   Osteoarthritis, multiple sites   He takes Meloxicam  daily to control his discomfort. No evidence of renal impairment noted. Will check CMP today.      Bilateral carpal tunnel syndrome (Chronic)   CTR on the left wrist is scheduled for tomorrow.      Prediabetes (Chronic)   Managed with diet only.  Recommend high  protein/low carb breakfast choices to reduce hypoglycemia. Lab Results  Component Value Date   HGBA1C 6.1 12/05/2022         Relevant Orders   Comprehensive metabolic panel with GFR   Hemoglobin A1c   Other Visit Diagnoses       Need for hepatitis C screening test       Relevant Orders   Hepatitis C antibody     Immunization due       recommend return for Prevnar-20 after surgery.     Nocturia       Relevant Orders   PSA      Patient also had a MAW visit this AM.  Return in about 6 months (around 04/10/2024) for HTN - new PCP.    Leita HILARIO Adie, MD [facility] [place]

## 2023-10-09 NOTE — Assessment & Plan Note (Signed)
 CTR on the left wrist is scheduled for tomorrow.

## 2023-10-09 NOTE — Assessment & Plan Note (Signed)
 Blood pressure is well controlled on Losartan . No medication side effects noted. Plan to continue current medications.

## 2023-10-09 NOTE — Assessment & Plan Note (Addendum)
 Reflux symptoms are controlled on omeprazole .  He has been unable to reduce the dose due to dysphagia. Patient denies red flag symptoms - no melena, weight loss, dysphagia.

## 2023-10-09 NOTE — Assessment & Plan Note (Signed)
 Currently on atorvastatin  without side effects. Lab Results  Component Value Date   LDLCALC 69 12/05/2022

## 2023-10-09 NOTE — Progress Notes (Signed)
 Subjective:   Lawrence White is a 73 y.o. male who presents for an Initial Medicare Annual Wellness Visit.  Visit Complete: In person  Patient Medicare AWV questionnaire was completed by the patient on 10/09/2023; I have confirmed that all information answered by patient is correct and no changes since this date.  Cardiac Risk Factors include: advanced age (>59men, >1 women);male gender     Objective:    Today's Vitals   10/09/23 0920 10/09/23 0921  BP: 108/62 108/62  Pulse: 70 70  Weight: 199 lb 6.4 oz (90.4 kg) 199 lb 6.4 oz (90.4 kg)  Height: 6' (1.829 m) 6' (1.829 m)  PainSc: 0-No pain 5    Body mass index is 27.04 kg/m.     10/09/2023    9:24 AM 03/27/2022   10:57 AM 03/13/2022   12:45 PM 02/28/2019   11:25 AM 04/01/2015    8:45 PM  Advanced Directives  Does Patient Have a Medical Advance Directive? Yes No Yes Yes Yes   Type of Advance Directive Living will  Living will;Healthcare Power of Attorney Living will;Healthcare Power of Attorney   Does patient want to make changes to medical advance directive? No - Patient declined      Copy of Healthcare Power of Attorney in Chart?   No - copy requested No - copy requested   Would patient like information on creating a medical advance directive?  No - Patient declined        Data saved with a previous flowsheet row definition    Current Medications (verified) Outpatient Encounter Medications as of 10/09/2023  Medication Sig   atorvastatin  (LIPITOR) 40 MG tablet Take 1 tablet (40 mg total) by mouth daily.   Cholecalciferol (VITAMIN D3) 50 MCG (2000 UT) capsule Take 2,000 Units by mouth daily.   fluticasone (FLONASE) 50 MCG/ACT nasal spray Place 1 spray into both nostrils daily as needed.   gabapentin  (NEURONTIN ) 100 MG capsule Take 1 capsule (100 mg total) by mouth 2 (two) times daily.   loratadine (CLARITIN) 10 MG tablet Take 10 mg by mouth at bedtime.    losartan  (COZAAR ) 100 MG tablet Take 1 tablet (100 mg total) by mouth  at bedtime.   Melatonin 1 MG CAPS Take by mouth.   meloxicam  (MOBIC ) 15 MG tablet Take 1 tablet (15 mg total) by mouth at bedtime.   omeprazole  (PRILOSEC) 20 MG capsule TAKE 1 CAPSULE DAILY BEFORE SUPPER   No facility-administered encounter medications on file as of 10/09/2023.    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   Gastroesophageal reflux    Hyperlipidemia    Hypertension    Motion sickness    boats, planes, [address]   Neuropathy    Feet and hands   PONV (postoperative nausea and vomiting)    Rheumatoid arthritis (HCC)    Seasonal allergies    Vertigo    none for several years   Past Surgical History:  Procedure Laterality Date   APPENDECTOMY  1971   BACK SURGERY     X5- 3 were fusions, 2 were bon spur removals, and releiving pressure on nerves   CARPAL TUNNEL RELEASE Right 04/2023   CATARACT EXTRACTION W/PHACO Right 03/13/2022   Procedure: CATARACT EXTRACTION PHACO AND  INTRAOCULAR LENS PLACEMENT (IOC) RIGHT  6.20  00:42.1;  Surgeon: Myrna Adine Anes, MD;  Location: [facility];  Service: Ophthalmology;  Laterality: Right;   CATARACT EXTRACTION W/PHACO Left 03/27/2022   Procedure: CATARACT EXTRACTION PHACO AND INTRAOCULAR LENS PLACEMENT (IOC) LEFT 5.35 00:37.0;  Surgeon: Myrna Adine Anes, MD;  Location: [facility];  Service: Ophthalmology;  Laterality: Left;   EYE SURGERY  2024   Cateracts removed both eyes   JOINT REPLACEMENT  2020 Rt Knee-2022 Rt Hip-2023 Lt Hip   SPINE SURGERY  2017   5 surgeries L4-S1 beginning mid-80's last 2017   TOTAL HIP ARTHROPLASTY Bilateral    Right in 2021, Left in 2022   TOTAL KNEE ARTHROPLASTY Right 03/07/2019   Procedure: TOTAL KNEE ARTHROPLASTY;  Surgeon: Shari Sieving, MD;  Location: [facility];  Service: Orthopedics;  Laterality: Right;   VASECTOMY     Family History  Problem Relation Age of Onset   Cancer Mother    Depression Mother    Diabetes Mother    Diabetes Father    Heart disease Father    Diabetes Sister    Social History   Socioeconomic History   Marital status: Married    Spouse name: Not on file   Number of children: Not on file   Years of education: Not on file   Highest education level: Bachelor's degree (e.g., BA, AB, BS)  Occupational History   Not on file  Tobacco Use   Smoking status: Former    Current packs/day: 0.00    Average packs/day: 1.5 packs/day for 19.0 years (28.5 ttl pk-yrs)    Types: Cigarettes, Pipe, Cigars    Start date: 01/29/1955    Quit date: 01/28/1974    Years since quitting: 49.7    Passive exposure: Past   Smokeless tobacco: Former    Types: Chew    Quit date: 12/02/1987  Vaping Use   Vaping status: Never Used  Substance and Sexual Activity   Alcohol use: Not Currently   Drug use: Never   Sexual activity: Not Currently    Birth control/protection: None  Other Topics Concern   Not on file  Social History Narrative   Not on file   Social Drivers of Health   Financial Resource Strain: Low Risk  (10/05/2023)   Overall Financial Resource Strain (CARDIA)    Difficulty of Paying Living Expenses: Not hard at all  Food Insecurity: No Food Insecurity (10/05/2023)   Hunger Vital Sign    Worried About Running Out of Food in the Last Year: Never true    Ran Out of Food in the Last Year: Never true  Transportation Needs: No Transportation Needs (10/05/2023)   PRAPARE - Administrator, Civil Service (Medical): No    Lack of Transportation (Non-Medical): No  Physical Activity: Insufficiently Active (10/05/2023)   Exercise Vital Sign    Days of Exercise per Week: 3 days    Minutes of Exercise per Session: 20 min  Stress: No Stress Concern Present (10/05/2023)   [employer] of Occupational Health -  Occupational Stress Questionnaire    Feeling of Stress: Not at all  Social Connections: Moderately Integrated (10/05/2023)   Social Connection and Isolation Panel    Frequency of Communication with Friends and Family: More than three times a week    Frequency of Social Gatherings with Friends and Family: Patient declined    Attends Religious Services: Patient declined    Database administrator or Organizations: Yes  Attends Engineer, structural: More than 4 times per year    Marital Status: Married    Tobacco Counseling Counseling given: Not Answered   Clinical Intake:  Pre-visit preparation completed: Yes  Pain : 0-10 Pain Score: 5  Pain Type: Chronic pain Pain Location: Back Pain Orientation: Medial Pain Radiating Towards: N/A Pain Descriptors / Indicators: Constant Pain Onset: More than a month ago Pain Frequency: Constant     BMI - recorded: 27.04 Nutritional Status: BMI 25 -29 Overweight Nutritional Risks: None Diabetes: No  How often do you need to have someone help you when you read instructions, pamphlets, or other written materials from your doctor or pharmacy?: 1 - Never  Interpreter Needed?: No  Information entered by :: Leonor Darnell, CMA   Activities of Daily Living    10/09/2023    9:25 AM  In your present state of health, do you have any difficulty performing the following activities:  Hearing? 1  Vision? 0  Difficulty concentrating or making decisions? 0  Walking or climbing stairs? 1  Dressing or bathing? 0  Doing errands, shopping? 0  Preparing Food and eating ? N  Using the Toilet? N  In the past six months, have you accidently leaked urine? N  Do you have problems with loss of bowel control? N  Managing your Medications? N  Managing your Finances? N  Housekeeping or managing your Housekeeping? N    Patient Care Team: Justus Leita DEL, MD as PCP - General (Internal Medicine) Colon Shove, MD as Consulting Physician  (Neurosurgery) Aundria Ladell POUR, MD as Consulting Physician (Gastroenterology) Myrna Adine Anes, MD as Consulting Physician (Ophthalmology) Verta Royden DASEN, DPM as Consulting Physician (Podiatry) Germaine Redbird, MD as Consulting Physician (Sports Medicine)  Indicate any recent Medical Services you may have received from other than [facility] providers in the past year (date may be approximate).     Assessment:   This is a routine wellness examination for Deni.  Hearing/Vision screen Hearing Screening - Comments:: Some difficulty hearing. Vision Screening - Comments:: Wears glasses and see's eye doctor regularly.   Goals Addressed             This Visit's Progress    Increase physical activity         Depression Screen    10/09/2023    9:17 AM 06/05/2023    1:15 PM 03/07/2023    3:52 PM  PHQ 2/9 Scores  PHQ - 2 Score 0 0 0  PHQ- 9 Score 3 2 0    Fall Risk    10/09/2023    9:17 AM 06/05/2023    1:15 PM 03/07/2023    3:51 PM 09/20/2018    4:26 PM 09/12/2016   12:50 PM  Fall Risk   Falls in the past year? 0 1 0 0  Yes   Comment    [employer] Telephone Survey: data to providers prior to load  [employer] Telephone Survey: data to providers prior to load   Number falls in past yr: 0 0 0  2 or more   Comment     Emmi Telephone Survey Actual Response = 2   Injury with Fall? 0 0 0  No   Risk for fall due to : No Fall Risks History of fall(s) No Fall Risks    Follow up Falls evaluation completed Falls evaluation completed Falls evaluation completed       Data saved with a previous flowsheet row definition    MEDICARE RISK AT  HOME: Medicare Risk at Home Any stairs in or around the home?: No If so, are there any without handrails?: No Home free of loose throw rugs in walkways, pet beds, electrical cords, etc?: Yes Adequate lighting in your home to reduce risk of falls?: Yes Life alert?: No Use of a cane, walker or w/c?: No Grab bars in the bathroom?: No Shower chair or bench in shower?:  No Elevated toilet seat or a handicapped toilet?: Yes  TIMED UP AND GO:  Was the test performed? Yes  Length of time to ambulate 10 feet: 8 sec Gait steady and fast without use of assistive device    Cognitive Function:        10/09/2023    9:18 AM  6CIT Screen  What Year? 0 points  What month? 0 points  What time? 0 points  Count back from 20 0 points  Months in reverse 0 points  Repeat phrase 0 points  Total Score 0 points    Immunizations Immunization History  Administered Date(s) Administered   Influenza Split 11/12/2007   Influenza-Unspecified 12/21/2017, 12/08/2022   Tdap 12/08/2022    TDAP status: Up to date  Flu Vaccine status: Due, Education has been provided regarding the importance of this vaccine. Advised may receive this vaccine at local pharmacy or Health Dept. Aware to provide a copy of the vaccination record if obtained from local pharmacy or Health Dept. Verbalized acceptance and understanding.  Pneumococcal vaccine status: Due, Education has been provided regarding the importance of this vaccine. Advised may receive this vaccine at local pharmacy or Health Dept. Aware to provide a copy of the vaccination record if obtained from local pharmacy or Health Dept. Verbalized acceptance and understanding.  Covid-19 vaccine status: Completed vaccines  Qualifies for Shingles Vaccine? Yes   Zostavax completed No   Shingrix  Completed?: No.    Education has been provided regarding the importance of this vaccine. Patient has been advised to call insurance company to determine out of pocket expense if they have not yet received this vaccine. Advised may also receive vaccine at local pharmacy or Health Dept. Verbalized acceptance and understanding.  Screening Tests Health Maintenance  Topic Date Due   Hepatitis C Screening  Never done   Pneumococcal Vaccine: 50+ Years (1 of 2 - PCV) Never done   Zoster Vaccines- Shingrix  (1 of 2) Never done   COVID-19 Vaccine (1 -  2024-25 season) Never done   INFLUENZA VACCINE  09/21/2023   Medicare Annual Wellness (AWV)  10/08/2024   Colonoscopy  10/04/2026   DTaP/Tdap/Td (2 - Td or Tdap) 12/07/2032   HPV VACCINES  Aged Out   Meningococcal B Vaccine  Aged Out    Health Maintenance  Health Maintenance Due  Topic Date Due   Hepatitis C Screening  Never done   Pneumococcal Vaccine: 50+ Years (1 of 2 - PCV) Never done   Zoster Vaccines- Shingrix  (1 of 2) Never done   COVID-19 Vaccine (1 - 2024-25 season) Never done   INFLUENZA VACCINE  09/21/2023    Colorectal cancer screening: Type of screening: Colonoscopy. Completed 10/03/2021. Repeat every 5 years  Lung Cancer Screening: (Low Dose CT Chest recommended if Age 51-80 years, 20 pack-year currently smoking OR have quit w/in 15years.) does not qualify.   Lung Cancer Screening Referral: N/A  Additional Screening:  Hepatitis C Screening: does not qualify.  Vision Screening: Recommended annual ophthalmology exams for early detection of glaucoma and other disorders of the eye. Is the patient up  to date with their annual eye exam?  Yes  Who is the provider or what is the name of the office in which the patient attends annual eye exams? [facility] If pt is not established with a provider, would they like to be referred to a provider to establish care? N/A.   Dental Screening: Recommended annual dental exams for proper oral hygiene  Community Resource Referral / Chronic Care Management: CRR required this visit?  No   CCM required this visit?  No    Plan:     I have personally reviewed and noted the following in the patient's chart:   Medical and social history Use of alcohol, tobacco or illicit drugs  Current medications and supplements including opioid prescriptions. Patient is not currently taking opioid prescriptions. Functional ability and status Nutritional status Physical activity Advanced directives List of other  physicians Hospitalizations, surgeries, and ER visits in previous 12 months Vitals Screenings to include cognitive, depression, and falls Referrals and appointments  In addition, I have reviewed and discussed with patient certain preventive protocols, quality metrics, and best practice recommendations. A written personalized care plan for preventive services as well as general preventive health recommendations were provided to patient.     Dawnelle Warman N Kenlee Maler, CMA   10/09/2023   After Visit Summary: (In Person-Printed) AVS printed and given to the patient  Nurse Notes: Patient scheduled to have CPE with PCP today.

## 2023-10-09 NOTE — Patient Instructions (Signed)
  Lawrence White , Thank you for taking time to come for your Medicare Wellness Visit. I appreciate your ongoing commitment to your health goals. Please review the following plan we discussed and let me know if I can assist you in the future.   These are the goals we discussed:  Goals      Increase physical activity        This is a list of the screening recommended for you and due dates:  Health Maintenance  Topic Date Due   Hepatitis C Screening  Never done   Pneumococcal Vaccine for age over 78 (1 of 2 - PCV) Never done   Zoster (Shingles) Vaccine (1 of 2) Never done   COVID-19 Vaccine (1 - 2024-25 season) Never done   Flu Shot  09/21/2023   Medicare Annual Wellness Visit  10/08/2024   Colon Cancer Screening  10/04/2026   DTaP/Tdap/Td vaccine (2 - Td or Tdap) 12/07/2032   HPV Vaccine  Aged Out   Meningitis B Vaccine  Aged Out

## 2023-10-10 ENCOUNTER — Other Ambulatory Visit: Payer: Self-pay | Admitting: Internal Medicine

## 2023-10-10 ENCOUNTER — Ambulatory Visit: Payer: Self-pay | Admitting: Internal Medicine

## 2023-10-10 DIAGNOSIS — I10 Essential (primary) hypertension: Secondary | ICD-10-CM | POA: Diagnosis not present

## 2023-10-10 DIAGNOSIS — M65311 Trigger thumb, right thumb: Secondary | ICD-10-CM | POA: Diagnosis not present

## 2023-10-10 DIAGNOSIS — M25531 Pain in right wrist: Secondary | ICD-10-CM | POA: Diagnosis not present

## 2023-10-10 DIAGNOSIS — G5602 Carpal tunnel syndrome, left upper limb: Secondary | ICD-10-CM | POA: Diagnosis not present

## 2023-10-10 LAB — URINALYSIS, ROUTINE W REFLEX MICROSCOPIC
Bilirubin, UA: NEGATIVE
Glucose, UA: NEGATIVE
Ketones, UA: NEGATIVE
Leukocytes,UA: NEGATIVE
Nitrite, UA: NEGATIVE
Protein,UA: NEGATIVE
RBC, UA: NEGATIVE
Specific Gravity, UA: 1.018 (ref 1.005–1.030)
Urobilinogen, Ur: 0.2 mg/dL (ref 0.2–1.0)
pH, UA: 6.5 (ref 5.0–7.5)

## 2023-10-10 LAB — CBC WITH DIFFERENTIAL/PLATELET
Basophils Absolute: 0 x10E3/uL (ref 0.0–0.2)
Basos: 1 %
EOS (ABSOLUTE): 0.2 x10E3/uL (ref 0.0–0.4)
Eos: 2 %
Hematocrit: 45.2 % (ref 37.5–51.0)
Hemoglobin: 14.4 g/dL (ref 13.0–17.7)
Immature Grans (Abs): 0 x10E3/uL (ref 0.0–0.1)
Immature Granulocytes: 0 %
Lymphocytes Absolute: 2.7 x10E3/uL (ref 0.7–3.1)
Lymphs: 33 %
MCH: 28.6 pg (ref 26.6–33.0)
MCHC: 31.9 g/dL (ref 31.5–35.7)
MCV: 90 fL (ref 79–97)
Monocytes Absolute: 0.5 x10E3/uL (ref 0.1–0.9)
Monocytes: 6 %
Neutrophils Absolute: 4.6 x10E3/uL (ref 1.4–7.0)
Neutrophils: 58 %
Platelets: 242 x10E3/uL (ref 150–450)
RBC: 5.03 x10E6/uL (ref 4.14–5.80)
RDW: 13.5 % (ref 11.6–15.4)
WBC: 8 x10E3/uL (ref 3.4–10.8)

## 2023-10-10 LAB — LIPID PANEL
Chol/HDL Ratio: 2.9 ratio (ref 0.0–5.0)
Cholesterol, Total: 130 mg/dL (ref 100–199)
HDL: 45 mg/dL (ref >39)
LDL Chol Calc (NIH): 67 mg/dL (ref 0–99)
Triglycerides: 95 mg/dL (ref 0–149)
VLDL Cholesterol Cal: 18 mg/dL (ref 5–40)

## 2023-10-10 LAB — COMPREHENSIVE METABOLIC PANEL WITH GFR
ALT: 26 IU/L (ref 0–44)
AST: 28 IU/L (ref 0–40)
Albumin: 4.5 g/dL (ref 3.8–4.8)
Alkaline Phosphatase: 63 IU/L (ref 44–121)
BUN/Creatinine Ratio: 10 (ref 10–24)
BUN: 9 mg/dL (ref 8–27)
Bilirubin Total: 1.4 mg/dL — ABNORMAL HIGH (ref 0.0–1.2)
CO2: 23 mmol/L (ref 20–29)
Calcium: 9.8 mg/dL (ref 8.6–10.2)
Chloride: 101 mmol/L (ref 96–106)
Creatinine, Ser: 0.88 mg/dL (ref 0.76–1.27)
Globulin, Total: 2.1 g/dL (ref 1.5–4.5)
Glucose: 105 mg/dL — ABNORMAL HIGH (ref 70–99)
Potassium: 4.2 mmol/L (ref 3.5–5.2)
Sodium: 140 mmol/L (ref 134–144)
Total Protein: 6.6 g/dL (ref 6.0–8.5)
eGFR: 90 mL/min/1.73 (ref >59)

## 2023-10-10 LAB — HEMOGLOBIN A1C
Est. average glucose Bld gHb Est-mCnc: 131 mg/dL
Hgb A1c MFr Bld: 6.2 % — ABNORMAL HIGH (ref 4.8–5.6)

## 2023-10-10 LAB — TSH: TSH: 1.21 u[IU]/mL (ref 0.450–4.500)

## 2023-10-10 LAB — PSA: Prostate Specific Ag, Serum: 1 ng/mL (ref 0.0–4.0)

## 2023-10-10 LAB — HEPATITIS C ANTIBODY: Hep C Virus Ab: NONREACTIVE

## 2023-10-11 NOTE — Telephone Encounter (Signed)
 Requested Prescriptions  Pending Prescriptions Disp Refills   losartan  (COZAAR ) 100 MG tablet [Pharmacy Med Name: LOSARTAN  TABS 100MG ] 90 tablet 1    Sig: TAKE 1 TABLET AT BEDTIME     Cardiovascular:  Angiotensin Receptor Blockers Passed - 10/11/2023  1:24 PM      Passed - Cr in normal range and within 180 days    Creatinine, Ser  Date Value Ref Range Status  10/09/2023 0.88 0.76 - 1.27 mg/dL Final   Creatinine, Urine  Date Value Ref Range Status  12/05/2022 141.8  Final         Passed - K in normal range and within 180 days    Potassium  Date Value Ref Range Status  10/09/2023 4.2 3.5 - 5.2 mmol/L Final         Passed - Patient is not pregnant      Passed - Last BP in normal range    BP Readings from Last 1 Encounters:  10/09/23 108/62         Passed - Valid encounter within last 6 months    Recent Outpatient Visits           2 days ago Essential hypertension   [place] [facility], Leita DEL, MD   4 months ago Essential hypertension   [place] [facility] at [facility], Leita DEL, MD               meloxicam  (MOBIC ) 15 MG tablet [Pharmacy Med Name: MELOXICAM  TABS 15MG ] 90 tablet 1    Sig: TAKE 1 TABLET AT BEDTIME     Analgesics:  COX2 Inhibitors Failed - 10/11/2023  1:24 PM      Failed - Manual Review: Labs are only required if the patient has taken medication for more than 8 weeks.      Passed - HGB in normal range and within 360 days    Hemoglobin  Date Value Ref Range Status  10/09/2023 14.4 13.0 - 17.7 g/dL Final         Passed - Cr in normal range and within 360 days    Creatinine, Ser  Date Value Ref Range Status  10/09/2023 0.88 0.76 - 1.27 mg/dL Final   Creatinine, Urine  Date Value Ref Range Status  12/05/2022 141.8  Final         Passed - HCT in normal range and within 360 days    Hematocrit  Date Value Ref Range Status  10/09/2023 45.2 37.5 - 51.0 %  Final         Passed - AST in normal range and within 360 days    AST  Date Value Ref Range Status  10/09/2023 28 0 - 40 IU/L Final         Passed - ALT in normal range and within 360 days    ALT  Date Value Ref Range Status  10/09/2023 26 0 - 44 IU/L Final         Passed - eGFR is 30 or above and within 360 days    GFR calc Af Amer  Date Value Ref Range Status  03/03/2019 >60 >60 mL/min Final   GFR calc non Af Amer  Date Value Ref Range Status  03/03/2019 >60 >60 mL/min Final   eGFR  Date Value Ref Range Status  10/09/2023 90 >59 mL/min/1.73 Final         Passed - Patient is not  pregnant      Passed - Valid encounter within last 12 months    Recent Outpatient Visits           2 days ago Essential hypertension   [place] [facility] at [facility], Leita DEL, MD   4 months ago Essential hypertension   [facility] at [facility], Leita DEL, MD

## 2023-10-25 ENCOUNTER — Other Ambulatory Visit: Payer: Self-pay | Admitting: Internal Medicine

## 2023-10-25 DIAGNOSIS — M65311 Trigger thumb, right thumb: Secondary | ICD-10-CM | POA: Diagnosis not present

## 2023-10-25 DIAGNOSIS — G5603 Carpal tunnel syndrome, bilateral upper limbs: Secondary | ICD-10-CM | POA: Diagnosis not present

## 2023-10-25 DIAGNOSIS — M25531 Pain in right wrist: Secondary | ICD-10-CM | POA: Diagnosis not present

## 2023-10-25 DIAGNOSIS — E782 Mixed hyperlipidemia: Secondary | ICD-10-CM

## 2023-10-25 DIAGNOSIS — M4317 Spondylolisthesis, lumbosacral region: Secondary | ICD-10-CM

## 2023-10-25 NOTE — Telephone Encounter (Signed)
 Requested Prescriptions  Pending Prescriptions Disp Refills   atorvastatin  (LIPITOR) 40 MG tablet [Pharmacy Med Name: ATORVASTATIN  TABS 40MG ] 90 tablet 1    Sig: TAKE 1 TABLET DAILY     Cardiovascular:  Antilipid - Statins Failed - 10/25/2023  2:35 PM      Failed - Lipid Panel in normal range within the last 12 months    Cholesterol, Total  Date Value Ref Range Status  10/09/2023 130 100 - 199 mg/dL Final   LDL Chol Calc (NIH)  Date Value Ref Range Status  10/09/2023 67 0 - 99 mg/dL Final   HDL  Date Value Ref Range Status  10/09/2023 45 >39 mg/dL Final   Triglycerides  Date Value Ref Range Status  10/09/2023 95 0 - 149 mg/dL Final         Passed - Patient is not pregnant      Passed - Valid encounter within last 12 months    Recent Outpatient Visits           2 weeks ago Essential hypertension   [place] [facility], Leita DEL, MD   4 months ago Essential hypertension   [place] [facility] at [facility], Leita DEL, MD               gabapentin  (NEURONTIN ) 100 MG capsule [Pharmacy Med Name: GABAPENTIN  CAPS 100MG ] 180 capsule 1    Sig: TAKE 1 CAPSULE TWICE A DAY     Neurology: Anticonvulsants - gabapentin  Passed - 10/25/2023  2:35 PM      Passed - Cr in normal range and within 360 days    Creatinine, Ser  Date Value Ref Range Status  10/09/2023 0.88 0.76 - 1.27 mg/dL Final   Creatinine, Urine  Date Value Ref Range Status  12/05/2022 141.8  Final         Passed - Completed PHQ-2 or PHQ-9 in the last 360 days      Passed - Valid encounter within last 12 months    Recent Outpatient Visits           2 weeks ago Essential hypertension   [place] [facility] at [facility], Leita DEL, MD   4 months ago Essential hypertension   [facility] at [facility], Leita DEL, MD

## 2023-12-07 DIAGNOSIS — Z23 Encounter for immunization: Secondary | ICD-10-CM | POA: Diagnosis not present

## 2024-01-10 DIAGNOSIS — M5416 Radiculopathy, lumbar region: Secondary | ICD-10-CM | POA: Diagnosis not present

## 2024-01-10 DIAGNOSIS — M5116 Intervertebral disc disorders with radiculopathy, lumbar region: Secondary | ICD-10-CM | POA: Diagnosis not present

## 2024-04-10 ENCOUNTER — Encounter: Admitting: Physician Assistant

## 2024-10-09 ENCOUNTER — Encounter
